# Patient Record
Sex: Male | Born: 1965 | Race: White | Hispanic: No | Marital: Married | Smoking: Never smoker
Health system: Southern US, Academic
[De-identification: ages and names within clinical notes are randomized; demographics above are authoritative.]

## PROBLEM LIST (undated history)

## (undated) DIAGNOSIS — J309 Allergic rhinitis, unspecified: Secondary | ICD-10-CM

## (undated) DIAGNOSIS — H9319 Tinnitus, unspecified ear: Secondary | ICD-10-CM

## (undated) DIAGNOSIS — M1990 Unspecified osteoarthritis, unspecified site: Secondary | ICD-10-CM

## (undated) DIAGNOSIS — I10 Essential (primary) hypertension: Secondary | ICD-10-CM

## (undated) DIAGNOSIS — K219 Gastro-esophageal reflux disease without esophagitis: Secondary | ICD-10-CM

## (undated) DIAGNOSIS — E7800 Pure hypercholesterolemia, unspecified: Secondary | ICD-10-CM

## (undated) HISTORY — PX: SKIN CANCER EXCISION: SHX779

## (undated) HISTORY — PX: HX ROTATOR CUFF REPAIR: SHX139

## (undated) HISTORY — PX: HX GALL BLADDER SURGERY/CHOLE: SHX55

## (undated) HISTORY — DX: Essential (primary) hypertension: I10

## (undated) HISTORY — DX: Unspecified osteoarthritis, unspecified site: M19.90

## (undated) HISTORY — PX: CARDIAC CATHETERIZATION: SHX172

## (undated) HISTORY — DX: Allergic rhinitis, unspecified: J30.9

## (undated) HISTORY — DX: Gastro-esophageal reflux disease without esophagitis: K21.9

## (undated) HISTORY — DX: Pure hypercholesterolemia, unspecified: E78.00

## (undated) HISTORY — PX: HX HIP REPLACEMENT: SHX124

---

## 1994-04-28 ENCOUNTER — Other Ambulatory Visit (HOSPITAL_COMMUNITY): Payer: Self-pay | Admitting: PHYSICIAN/UNDEFINED PHYSICIAN TYPE

## 2012-04-05 DIAGNOSIS — R1310 Dysphagia, unspecified: Secondary | ICD-10-CM | POA: Insufficient documentation

## 2012-04-05 DIAGNOSIS — R490 Dysphonia: Secondary | ICD-10-CM | POA: Insufficient documentation

## 2022-09-29 ENCOUNTER — Ambulatory Visit (RURAL_HEALTH_CENTER): Payer: 59 | Attending: INTERNAL MEDICINE | Admitting: INTERNAL MEDICINE

## 2022-09-29 ENCOUNTER — Encounter (RURAL_HEALTH_CENTER): Payer: Self-pay | Admitting: INTERNAL MEDICINE

## 2022-09-29 ENCOUNTER — Other Ambulatory Visit (RURAL_HEALTH_CENTER): Payer: Self-pay | Admitting: INTERNAL MEDICINE

## 2022-09-29 ENCOUNTER — Other Ambulatory Visit: Payer: Self-pay

## 2022-09-29 ENCOUNTER — Ambulatory Visit: Payer: 59 | Attending: INTERNAL MEDICINE | Admitting: INTERNAL MEDICINE

## 2022-09-29 VITALS — BP 131/84 | HR 76 | Resp 18 | Ht 71.0 in | Wt 208.0 lb

## 2022-09-29 DIAGNOSIS — E559 Vitamin D deficiency, unspecified: Secondary | ICD-10-CM | POA: Insufficient documentation

## 2022-09-29 DIAGNOSIS — Z23 Encounter for immunization: Secondary | ICD-10-CM | POA: Insufficient documentation

## 2022-09-29 DIAGNOSIS — Z125 Encounter for screening for malignant neoplasm of prostate: Secondary | ICD-10-CM

## 2022-09-29 DIAGNOSIS — M1990 Unspecified osteoarthritis, unspecified site: Secondary | ICD-10-CM | POA: Insufficient documentation

## 2022-09-29 DIAGNOSIS — M26609 Unspecified temporomandibular joint disorder, unspecified side: Secondary | ICD-10-CM | POA: Insufficient documentation

## 2022-09-29 DIAGNOSIS — K219 Gastro-esophageal reflux disease without esophagitis: Secondary | ICD-10-CM | POA: Insufficient documentation

## 2022-09-29 DIAGNOSIS — E782 Mixed hyperlipidemia: Secondary | ICD-10-CM | POA: Insufficient documentation

## 2022-09-29 DIAGNOSIS — J309 Allergic rhinitis, unspecified: Secondary | ICD-10-CM | POA: Insufficient documentation

## 2022-09-29 DIAGNOSIS — M159 Polyosteoarthritis, unspecified: Secondary | ICD-10-CM | POA: Insufficient documentation

## 2022-09-29 LAB — COMPREHENSIVE METABOLIC PNL, FASTING
ALBUMIN/GLOBULIN RATIO: 1.7 — ABNORMAL HIGH (ref 0.8–1.4)
ALBUMIN: 4.5 g/dL (ref 3.5–5.7)
ALKALINE PHOSPHATASE: 50 U/L (ref 34–104)
ALT (SGPT): 20 U/L (ref 7–52)
ANION GAP: 6 mmol/L (ref 4–13)
AST (SGOT): 16 U/L (ref 13–39)
BILIRUBIN TOTAL: 0.5 mg/dL (ref 0.3–1.2)
BUN/CREA RATIO: 18 (ref 6–22)
BUN: 18 mg/dL (ref 7–25)
CALCIUM, CORRECTED: 9.3 mg/dL (ref 8.9–10.8)
CALCIUM: 9.7 mg/dL (ref 8.6–10.3)
CHLORIDE: 105 mmol/L (ref 98–107)
CO2 TOTAL: 27 mmol/L (ref 21–31)
CREATININE: 0.98 mg/dL (ref 0.60–1.30)
ESTIMATED GFR: 91 mL/min/{1.73_m2} (ref >59)
GLOBULIN: 2.7 — ABNORMAL LOW (ref 2.9–5.4)
GLUCOSE: 99 mg/dL (ref 74–109)
OSMOLALITY, CALCULATED: 278 mOsm/kg (ref 270–290)
POTASSIUM: 4.6 mmol/L (ref 3.5–5.1)
PROTEIN TOTAL: 7.2 g/dL (ref 6.4–8.9)
SODIUM: 138 mmol/L (ref 136–145)

## 2022-09-29 LAB — CBC WITH DIFF
BASOPHIL #: 0 10*3/uL (ref 0.00–0.10)
BASOPHIL %: 1 % (ref 0–1)
EOSINOPHIL #: 0.2 10*3/uL (ref 0.00–0.50)
EOSINOPHIL %: 3 %
HCT: 45.9 % (ref 36.7–47.1)
HGB: 15.7 g/dL (ref 12.5–16.3)
LYMPHOCYTE #: 1.4 10*3/uL (ref 1.00–3.00)
LYMPHOCYTE %: 18 % (ref 16–44)
MCH: 30.9 pg (ref 23.8–33.4)
MCHC: 34.3 g/dL (ref 32.5–36.3)
MCV: 90.1 fL (ref 73.0–96.2)
MONOCYTE #: 0.5 10*3/uL (ref 0.30–1.00)
MONOCYTE %: 7 % (ref 5–13)
MPV: 8.1 fL (ref 7.4–11.4)
NEUTROPHIL #: 5.5 10*3/uL (ref 1.85–7.80)
NEUTROPHIL %: 72 % (ref 43–77)
PLATELETS: 196 10*3/uL (ref 140–440)
RBC: 5.1 10*6/uL (ref 4.06–5.63)
RDW: 12.9 % (ref 12.1–16.2)
WBC: 7.7 10*3/uL (ref 3.6–10.2)

## 2022-09-29 LAB — LIPID PANEL
CHOL/HDL RATIO: 6.3
CHOLESTEROL: 250 mg/dL — ABNORMAL HIGH (ref <200)
HDL CHOL: 40 mg/dL (ref 23–92)
LDL CALC: 173 mg/dL — ABNORMAL HIGH (ref 0–100)
TRIGLYCERIDES: 183 mg/dL — ABNORMAL HIGH (ref <=150)
VLDL CALC: 37 mg/dL (ref 0–50)

## 2022-09-29 LAB — VITAMIN D 25 TOTAL: VITAMIN D: 60 ng/mL (ref 30–100)

## 2022-09-29 LAB — PSA SCREENING: PSA: 1.53 ng/mL (ref <=4.00)

## 2022-09-29 MED ORDER — FLUTICASONE PROPIONATE 50 MCG/ACTUATION NASAL SPRAY,SUSPENSION
1.0000 | Freq: Two times a day (BID) | NASAL | 3 refills | Status: DC
Start: 2022-09-29 — End: 2023-01-11

## 2022-09-29 MED ORDER — OMEPRAZOLE 40 MG CAPSULE,DELAYED RELEASE
40.0000 mg | DELAYED_RELEASE_CAPSULE | Freq: Every day | ORAL | 3 refills | Status: DC
Start: 2022-09-29 — End: 2023-01-11

## 2022-09-29 MED ORDER — FAMOTIDINE 40 MG TABLET
40.0000 mg | ORAL_TABLET | Freq: Two times a day (BID) | ORAL | 3 refills | Status: DC
Start: 2022-09-29 — End: 2023-01-11

## 2022-09-29 MED ORDER — MONTELUKAST 10 MG TABLET
10.0000 mg | ORAL_TABLET | Freq: Every evening | ORAL | 3 refills | Status: DC
Start: 2022-09-29 — End: 2023-01-11

## 2022-09-29 NOTE — Nursing Note (Signed)
Patient is here for a follow up. No new issues.

## 2022-09-29 NOTE — Progress Notes (Signed)
[facility]  [address]  [place], [place]  [address]  Phone: [phone] Fax: [phone]    Name: Isaiah Long                       Date of Birth: Jan 27, 1966   MRN:  [id number]                         Date of visit: 09/29/2022     Chief Complaint: Follow Up (No new issues)    Current Outpatient Medications   Medication Sig    famotidine (PEPCID) 40 mg Oral Tablet Take 1 Tablet (40 mg total) by mouth Twice daily    fluticasone propionate (FLONASE) 50 mcg/actuation Nasal Spray, Suspension Administer 1 Spray into each nostril Twice daily    montelukast (SINGULAIR) 10 mg Oral Tablet Take 1 Tablet (10 mg total) by mouth Every evening    omeprazole (PRILOSEC) 40 mg Oral Capsule, Delayed Release(E.C.) Take 1 Capsule (40 mg total) by mouth Once a day       Patient Active Problem List    Diagnosis Date Noted    Mixed hyperlipidemia 09/29/2022    Gastroesophageal reflux disease without esophagitis 09/29/2022    Osteoarthritis 09/29/2022    Allergic rhinitis 09/29/2022    Vitamin D deficiency 09/29/2022    TMJ (temporomandibular joint disorder) 09/29/2022       Subjective:   Patient is here for CDM visit. Last visit was 08-05-21.    Colonoscopy scheduled next month.     Dyspnea  Ongoing since early 08-2020. Essentially since COVID diagnosis.  Following with Dr Viann Shove.  Per office note [phone] plan was holter, stress test, and echo.  10-03-20 CXR showed chronic pleural thickening.  [id number] Holter, Stress Test, and Echo were negative. Dr Viann Shove thinks its probably stress related.    GERD  On omeprazole and famotidine.  Following with Dr Keith Rake.    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  [place] in 2020 showed resolution    Hyperlipidemia  Has not generally tolerated statins 2/2 myalgia.  Was on Zetia. No meds in last few months.   Patient has history of muscle/tendon tear.  Does not qualify for injectables  Labs on 08-17-2019 showed total cholesterol 218, triglycerides  176, HDL 33, direct LDL 133. He was not on treatment.  Labs on 11-27-2019 showed total cholesterol 141, triglycerides 92, HDL 34, direct LDL 87  Labs on 02-27-2020 showed total cholesterol 165, triglycerides 99, HDL 34, direct LDL 112  Labs on 10-03-2020 showed total cholesterol 197, triglycerides 153, HDL 35, direct LDL 112. Dr Viann Shove stopped the lipitor and started zetia  Labs on 08-03-2021 showed total cholesterol 245, triglycerides 217, HDL 40, calculatedLDL 162.      Osteoarthritis  Primarily in left hip  Was on Ibuprofen  03-2021 left hip replacement with Dr Marcelino Scot    Allergic Rhinitis  On Flonase and Singulair.     Vitamin D Deficiency  Was on replacement  Labs on 08-17-2019 showed vitamin D 44.4  Labs on 11-27-2019 showed vitamin D 41.3  Labs on 02-11/2020 showed vitamin D 34.4    Itchy Ears/TMJ  Has been following with ENT.  Currently on drops (DermOtic oil)    ROS:  10 systems reviewed and were negative except as noted.   + joint pain    Objective :  BP 131/84 (Site: Left, Patient Position:  Sitting, Cuff Size: Adult)   Pulse 76   Resp 18   Ht 1.803 m (5\' 11" )   Wt 94.3 kg (208 lb)   SpO2 96%   BMI 29.01 kg/m       General:  appears in good health, comfortable  Lungs:  Clear to auscultation and percussion bilaterally.   Cardiovascular:  regular rate and rhythm, S1, S2 normal, no murmur, click, rub or gallop  Abdomen:  non-distended, Soft, non-tender  Neurologic:  Grossly normal. Alert and oriented x3  Psychiatric:  Normal    Data reviewed:      Assessment/Plan:  Assessment/Plan   1. Mixed hyperlipidemia    2. Gastroesophageal reflux disease without esophagitis    3. Primary osteoarthritis involving multiple joints    4. Allergic rhinitis    5. Vitamin D deficiency    6. TMJ (temporomandibular joint disorder)        Allergic Rhinitis  Continue Flonase and montelukast    GERD  Continue Omeprazole and Famotidine    Hyperlipidemia  Monitor    Vitamin D Deficiency  Monitor    TMJ  Follow with  ENT    Dyspnea  Follow with cardiology  Probably related to combination of stress and Long COVID Sx    OSA  Mild  Dr Linton Rump said not severe enough for CPAP yet    He will check on vaccines at his pharmacy.     Casimer Lanius, DO, [facility]  Internal Medicine

## 2022-09-30 ENCOUNTER — Other Ambulatory Visit (RURAL_HEALTH_CENTER): Payer: Self-pay | Admitting: INTERNAL MEDICINE

## 2022-09-30 MED ORDER — EZETIMIBE 10 MG TABLET
10.0000 mg | ORAL_TABLET | Freq: Every evening | ORAL | 4 refills | Status: DC
Start: 2022-09-30 — End: 2023-01-11

## 2022-10-01 LAB — LDL CHOLESTEROL, DIRECT: LDL DIRECT: 178 mg/dL — ABNORMAL HIGH (ref <100)

## 2022-10-13 ENCOUNTER — Other Ambulatory Visit (RURAL_HEALTH_CENTER): Payer: Self-pay | Admitting: INTERNAL MEDICINE

## 2022-10-13 MED ORDER — ATENOLOL 50 MG TABLET
50.0000 mg | ORAL_TABLET | Freq: Every day | ORAL | 4 refills | Status: AC
Start: 2022-10-13 — End: ?

## 2022-11-01 ENCOUNTER — Other Ambulatory Visit: Payer: Self-pay

## 2022-11-01 ENCOUNTER — Ambulatory Visit (INDEPENDENT_AMBULATORY_CARE_PROVIDER_SITE_OTHER): Payer: 59 | Admitting: NURSE PRACTITIONER

## 2022-11-01 ENCOUNTER — Encounter (INDEPENDENT_AMBULATORY_CARE_PROVIDER_SITE_OTHER): Payer: Self-pay | Admitting: NURSE PRACTITIONER

## 2022-11-01 VITALS — Ht 71.0 in | Wt 205.0 lb

## 2022-11-01 DIAGNOSIS — H9313 Tinnitus, bilateral: Secondary | ICD-10-CM

## 2022-11-01 DIAGNOSIS — R519 Headache, unspecified: Secondary | ICD-10-CM

## 2022-11-01 DIAGNOSIS — J343 Hypertrophy of nasal turbinates: Secondary | ICD-10-CM

## 2022-11-01 DIAGNOSIS — R0683 Snoring: Secondary | ICD-10-CM

## 2022-11-01 DIAGNOSIS — G4730 Sleep apnea, unspecified: Secondary | ICD-10-CM

## 2022-11-01 NOTE — Progress Notes (Signed)
ENT, [place]  [place] [address]  Phone: [phone]  Fax: [phone]      Encounter Date: 11/01/2022    Patient ID: Isaiah Long  MRN: [id number]    DOB: 01/07/1966  Age: 57 y.o. male     Progress Note       Referring Provider:  No ref. provider found    Reason for Visit:   Chief Complaint   Patient presents with    Sinus Problem     Patient complains of sinus headache/pain x 5-6 weeks. States just started new BP med d/t to ha and BP being elevated. Pt states he also snores        History of Present Illness:  Isaiah Long is a 57 y.o. male new complaint of headache and increased snoring.  Concern he could have sinusitis.  He also reports having an elevated BP for several weeks.  Has been started on BP medications and BP is better controlled.  Also having increased bilateral tinnitus      Patient History:  Patient Active Problem List   Diagnosis    Mixed hyperlipidemia    Gastroesophageal reflux disease without esophagitis    Osteoarthritis    Allergic rhinitis    Vitamin D deficiency    TMJ (temporomandibular joint disorder)    Dysphagia    Dysphonia     Current Outpatient Medications   Medication Sig    atenoloL (TENORMIN) 50 mg Oral Tablet Take 1 Tablet (50 mg total) by mouth Once a day    ezetimibe (ZETIA) 10 mg Oral Tablet Take 1 Tablet (10 mg total) by mouth Every evening    famotidine (PEPCID) 40 mg Oral Tablet Take 1 Tablet (40 mg total) by mouth Twice daily    fluticasone propionate (FLONASE) 50 mcg/actuation Nasal Spray, Suspension Administer 1 Spray into each nostril Twice daily    montelukast (SINGULAIR) 10 mg Oral Tablet Take 1 Tablet (10 mg total) by mouth Every evening    omeprazole (PRILOSEC) 40 mg Oral Capsule, Delayed Release(E.C.) Take 1 Capsule (40 mg total) by mouth Once a day      Allergies   Allergen Reactions    Doxycycline Rash    Dexamethasone (Pf)  Other Adverse Reaction (Add comment)     hiccups     Past Medical History:   Diagnosis Date    Allergic  rhinitis     Esophageal reflux     Essential hypertension     Hypercholesterolemia     Osteoarthritis      Past Surgical History:   Procedure Laterality Date    HX CHOLECYSTECTOMY      HX HIP REPLACEMENT      HX ROTATOR CUFF REPAIR       Family Medical History:       Problem Relation (Age of Onset)    Asthma Mother    Atrial fibrillation Mother    Prostate Cancer Father            Social History     Tobacco Use    Smoking status: Never    Smokeless tobacco: Never   Vaping Use    Vaping status: Never Used   Substance Use Topics    Alcohol use: Never    Drug use: Never       Review of Systems     Vitals:    11/01/22 0942   Weight: 93 kg (205 lb)   Height: 1.803 m (5\' 11" )  BMI: 28.65      ENT Physical Exam  Constitutional  Appearance: patient appears well-developed, well-nourished and well-groomed,  Communication/Voice: communication appropriate for developmental age; vocal quality normal;  Head and Face  Appearance: head appears normal, face appears normal and face appears atraumatic;  Palpation: facial palpation normal;  Salivary: glands normal;  Ear  Hearing: intact;  Auricles: right auricle normal; left auricle normal;  External Mastoids: right external mastoid normal; left external mastoid normal;  Ear Canals: right ear canal normal; left ear canal normal;  Tympanic Membranes: right tympanic membrane normal; left tympanic membrane normal;  Nose  External Nose: nares patent bilaterally; external nose normal;  Internal Nose: nasal mucosa normal; septum normal; bilateral inferior turbinates normal;  Oral Cavity/Oropharynx  Lips: normal;  Teeth: normal;  Gums: gingiva normal;  Tongue: normal;  Oral mucosa: normal;  Hard palate: normal;  Soft palate: normal;  Tonsils: normal;  Base of Tongue: normal;  Posterior pharyngeal wall: normal;  Neck  Neck: neck normal; neck palpation normal;  Thyroid: thyroid normal;  Respiratory  Inspection: breathing unlabored; normal breathing rate;  Lymphatic  Palpation: lymph nodes  normal;  Neurovestibular  Mental Status: alert and oriented;  Psychiatric: mood normal; affect is appropriate;  Cranial Nerves: cranial nerves intact;       Assessment:  ENCOUNTER DIAGNOSES     ICD-10-CM   1. Sleep disorder breathing  G47.30   2. Headache  R51.9   3. 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  No sinusitis noted on nasal endoscopy  Headache and tinnitus could be due to elevated BP.  Advised to continue to follow up with PCP for management of BP.  PSG scheduled to further evaluate snoring and headache  Audiogram scheduled.    Orders Placed This Encounter    [phone] - NASAL ENDOSCOPY DIAGNOSTIC UNILATERAL OR BILATERAL (AMB ONLY)    AMB PRN REFERRAL EXTERNAL AUDIOLOGIST    POLYSOMNOGRAPHY-ANP OVERNIGHT-1ST NIGHT OF STUDY (F/U WITH CPAP IF ANP MEETS CLINICAL CRITERIA)     No follow-ups on file.    Emiliano Dyer, FNP-BC  11/01/2022, 10:03

## 2022-11-08 ENCOUNTER — Encounter (INDEPENDENT_AMBULATORY_CARE_PROVIDER_SITE_OTHER): Payer: Self-pay | Admitting: NURSE PRACTITIONER

## 2022-11-08 NOTE — Procedures (Signed)
ENT, [place]  [place] [address]    Procedure Note    Name: GERALD HARDAWAY MRN:  [id number]   Date: 11/01/2022 DOB:  March 27, 1966 (57 y.o.)         [address]31 - NASAL ENDOSCOPY DIAGNOSTIC UNILATERAL OR BILATERAL (AMB ONLY)    Performed by: Emiliano Dyer, FNP-BC  Authorized by: Emiliano Dyer, FNP-BC    Time Out:     Immediately before the procedure, a time out was called:  Yes    Patient verified:  Yes    Procedure Verified:  Yes    Site Verified:  Yes  Documentation:      Indications for procedure: Snoring / Sleep apnea    Anesthesia: Oxymetazoline nasal spray    Description: Nasal endoscopy with rigid scope was performed with examination of the  septum, inferior, middle, and superior meatus, turbinates, sphenoethmoidal recess, and nasopharynx.     There were no polyps, pus, or granulation tissue noted.  ET orifices and nasopharynx were normal.     Findings: Inferior turbinate hypertrophy    The patient tolerated the procedure well.               Emiliano Dyer, FNP-BC

## 2022-11-18 ENCOUNTER — Encounter (INDEPENDENT_AMBULATORY_CARE_PROVIDER_SITE_OTHER): Payer: Self-pay | Admitting: NURSE PRACTITIONER

## 2022-12-01 ENCOUNTER — Other Ambulatory Visit (INDEPENDENT_AMBULATORY_CARE_PROVIDER_SITE_OTHER): Payer: Self-pay | Admitting: NURSE PRACTITIONER

## 2022-12-01 DIAGNOSIS — G4730 Sleep apnea, unspecified: Secondary | ICD-10-CM

## 2022-12-06 ENCOUNTER — Other Ambulatory Visit (RURAL_HEALTH_CENTER): Payer: Self-pay

## 2022-12-07 ENCOUNTER — Encounter (INDEPENDENT_AMBULATORY_CARE_PROVIDER_SITE_OTHER): Payer: 59 | Admitting: NURSE PRACTITIONER

## 2022-12-24 ENCOUNTER — Ambulatory Visit (HOSPITAL_COMMUNITY): Payer: 59

## 2022-12-24 ENCOUNTER — Ambulatory Visit (HOSPITAL_COMMUNITY): Payer: Self-pay

## 2022-12-27 ENCOUNTER — Other Ambulatory Visit: Payer: Self-pay

## 2022-12-27 ENCOUNTER — Inpatient Hospital Stay
Admission: RE | Admit: 2022-12-27 | Discharge: 2022-12-27 | Disposition: A | Discharge status: Home / Self Care | Payer: 59 | Source: Ambulatory Visit | Attending: NURSE PRACTITIONER | Admitting: NURSE PRACTITIONER

## 2022-12-27 DIAGNOSIS — G4730 Sleep apnea, unspecified: Secondary | ICD-10-CM | POA: Insufficient documentation

## 2022-12-28 ENCOUNTER — Encounter (INDEPENDENT_AMBULATORY_CARE_PROVIDER_SITE_OTHER): Payer: 59 | Admitting: NURSE PRACTITIONER

## 2022-12-28 NOTE — Procedures (Signed)
[facility]    Home Sleep Study    Patient Name: Isaiah Long  Date of Birth: 1965-09-14  Gender: male  Provider: Roxana Hires  Location: [facility]  Location Address: [address] [place], [address]  Location Phone: [phone]      Primary Care Provider: Terald Sleeper, DO    Procedure Home Sleep Test     Past Medical History  Past Medical History:   Diagnosis Date    Allergic rhinitis     Esophageal reflux     Essential hypertension     Hypercholesterolemia     Osteoarthritis            Past Surgical History  Past Surgical History:   Procedure Laterality Date    HX CHOLECYSTECTOMY      HX HIP REPLACEMENT      HX ROTATOR CUFF REPAIR             Medication LIst  No outpatient medications have been marked as taking for the 12/27/22 encounter [facility] Encounter) with SLEEP LAB RM 1 PRN.       Allergy List  Allergies   Allergen Reactions    Doxycycline Rash    Dexamethasone (Pf)  Other Adverse Reaction (Add comment)     hiccups       Family Medical History  Family Medical History:       Problem Relation (Age of Onset)    Asthma Mother    Atrial fibrillation Mother    Prostate Cancer Father              Social History  Social History     Socioeconomic History    Marital status: Married     Spouse name: Randa    Number of children: 8   Tobacco Use    Smoking status: Never    Smokeless tobacco: Never   Vaping Use    Vaping status: Never Used   Substance and Sexual Activity    Alcohol use: Never    Drug use: Never         Patient Information  Sleep apnea symptoms: Snoring and Witnessed Apneas  Patient's Height: 71 inches  Patient's Weight: 205  Patient's BMI: 32  Neck Circumference: 15  Epworth Scale Score: 7      Date of Study: 1966-01-28  Study Performed By: Ula Lingo, RRT, RPSGT, [facility]  Study Reviewed By: Ula Lingo, RRT, RPSGT, [facility]    Procedure: Home Sleep study was Performed using: Alice night one. Channels recorded: Airflow acquired by using a nasal  pressure cannula, oxygen saturation (SpO2) was monitored using a pulse oximeter, respiratory effort, snoring sound , and heart rate    Sleep Architecture: Total Recording Time: 482.5 min. Total Sleep Time: 482.5 min.     Respiratory Data: Number of Obstructive Apneas Observed: 17. Number of Central Apneas Observed: 0. Number of Mixed Apneas Observed: 0. Total Number of Apneas: 17. Total number of Hypopneas: 26. Apnea/Hypoapnea Index (AHI) per hour: 5.3. Percent of Time Patient Spent in Supine Position: 173.3. AHI in Supine Position : 6.2. Non-supine AHI #: 3.4. Lowest Desaturation Percentage: 83%. Total Amount of Desaturated Time Below or Equal  to 7.3 minutes. Snoring During the Study: Status: Yes.     The study was scored by: 30 second EPOCH, 4 % desaturation and  CMS guidelines.AASM Accredited     Cardiac Data:  The Mean Heart Rate During the Study (Beats/Min): 56.2.   Assessment:  Diagnosis:   (R06.83) Snoring (SCT) and (G47.33) OSA - Obstructive sleep apnea (SCT)    PLAN  Procedure Orders    In lab Sleep Study    Instructions:   Patient advised to avoid sedatives, hypnotics, sedating antihistamines and alcohol until CPAP titration is completed and therapy initiated., Reviewed raw sleep data and agree with therapeutic directions outlined in report., The natural history of obstructive sleep apnea and its tendency to be associated with cardiac, pulmonary, neuropsychiatric discussed in detail with patient., The etiology of OSA was discussed in detail with patient, All benefits of CPAP/BiPAP discussed in detail, Advised to call if any difficulties or questions regarding CPAP/BiPAP device, and Positional Therapy may be helpful in snoring    Impression:  Ahi 5.3 per hour and Mild OSA     Claudell Kyle, RRT    Sleep attending  I reviewed the raw data from the home sleep study in detail.    Patient had good sleep efficiency.    Patient had mild OSA AHI of 5.3  We will need in-lab titration study.    Charlaine Dalton,  MD  01/03/2023 08:11

## 2023-01-10 ENCOUNTER — Ambulatory Visit (INDEPENDENT_AMBULATORY_CARE_PROVIDER_SITE_OTHER): Payer: 59 | Admitting: NURSE PRACTITIONER

## 2023-01-10 ENCOUNTER — Other Ambulatory Visit: Payer: Self-pay

## 2023-01-10 ENCOUNTER — Encounter (INDEPENDENT_AMBULATORY_CARE_PROVIDER_SITE_OTHER): Payer: Self-pay | Admitting: NURSE PRACTITIONER

## 2023-01-10 VITALS — BP 134/88 | Ht 71.0 in | Wt 207.0 lb

## 2023-01-10 DIAGNOSIS — G4733 Obstructive sleep apnea (adult) (pediatric): Secondary | ICD-10-CM

## 2023-01-10 DIAGNOSIS — H9313 Tinnitus, bilateral: Secondary | ICD-10-CM

## 2023-01-10 DIAGNOSIS — H903 Sensorineural hearing loss, bilateral: Secondary | ICD-10-CM

## 2023-01-10 NOTE — Progress Notes (Signed)
ENT, [facility]  [address]  [place] [address]  Phone: [phone]  Fax: [phone]      Encounter Date: 01/10/2023    Patient ID: Isaiah Long  MRN: [id number]    DOB: 03-Apr-1966  Age: 57 y.o. male     Progress Note       Referring Provider:  No ref. provider found    Reason for Visit:   Chief Complaint   Patient presents with    Follow-up After Testing     Rc after psg and audio, patient states ha is still present        History of Present Illness:  Isaiah Long is a 57 y.o. male follow up after PSG for chronic headache and snoring.  Audiogram completed due to tinnitus.      Patient History:  Patient Active Problem List   Diagnosis    Mixed hyperlipidemia    Gastroesophageal reflux disease without esophagitis    Osteoarthritis    Allergic rhinitis    Vitamin D deficiency    TMJ (temporomandibular joint disorder)    Dysphagia    Dysphonia    Statin myopathy     Current Outpatient Medications   Medication Sig    amoxicillin-pot clavulanate (AUGMENTIN) 875-125 mg Oral Tablet Take 1 Tablet by mouth Twice daily    atenoloL (TENORMIN) 50 mg Oral Tablet Take 1 Tablet (50 mg total) by mouth Once a day    famotidine (PEPCID) 40 mg Oral Tablet Take 1 Tablet (40 mg total) by mouth Twice daily    fluticasone propionate (FLONASE) 50 mcg/actuation Nasal Spray, Suspension Administer 1 Spray into each nostril Twice daily    montelukast (SINGULAIR) 10 mg Oral Tablet Take 1 Tablet (10 mg total) by mouth Every evening    omeprazole (PRILOSEC) 40 mg Oral Capsule, Delayed Release(E.C.) Take 1 Capsule (40 mg total) by mouth Once a day      Allergies   Allergen Reactions    Doxycycline Rash    Dexamethasone (Pf)  Other Adverse Reaction (Add comment)     hiccups     Past Medical History:   Diagnosis Date    Allergic rhinitis     Esophageal reflux     Essential hypertension     Hypercholesterolemia     Osteoarthritis      Past Surgical History:   Procedure Laterality Date    HX CHOLECYSTECTOMY      HX HIP  REPLACEMENT      HX ROTATOR CUFF REPAIR      SKIN CANCER EXCISION       Family Medical History:       Problem Relation (Age of Onset)    Asthma Mother    Atrial fibrillation Mother    Prostate Cancer Father            Social History     Tobacco Use    Smoking status: Never    Smokeless tobacco: Never   Vaping Use    Vaping status: Never Used   Substance Use Topics    Alcohol use: Never    Drug use: Never       Review of Systems     Vitals:    01/10/23 0826   BP: 134/88   Weight: 93.9 kg (207 lb)   Height: 1.803 m (5\' 11" )   BMI: 28.93      ENT Physical Exam  Constitutional  Appearance: patient appears well-developed, well-nourished and well-groomed,  Communication/Voice: communication appropriate for developmental  age; vocal quality normal;  Head and Face  Appearance: head appears normal, face appears normal and face appears atraumatic;  Palpation: facial palpation normal;  Salivary: glands normal;  Ear  Hearing: intact;  Auricles: right auricle normal; left auricle normal;  External Mastoids: right external mastoid normal; left external mastoid normal;  Ear Canals: right ear canal normal; left ear canal normal;  Tympanic Membranes: right tympanic membrane normal; left tympanic membrane normal;  Nose  External Nose: nares patent bilaterally; external nose normal;  Internal Nose: nasal mucosa normal; septum normal; bilateral inferior turbinates normal;  Oral Cavity/Oropharynx  Lips: normal;  Teeth: normal;  Gums: gingiva normal;  Tongue: normal;  Oral mucosa: normal;  Hard palate: normal;  Soft palate: normal;  Tonsils: normal;  Base of Tongue: normal;  Posterior pharyngeal wall: normal;  Neck  Neck: neck normal; neck palpation normal;  Thyroid: thyroid normal;  Respiratory  Inspection: breathing unlabored; normal breathing rate;  Lymphatic  Palpation: lymph nodes normal;  Neurovestibular  Mental Status: alert and oriented;  Psychiatric: mood normal; affect is appropriate;  Cranial Nerves: cranial nerves intact;        Assessment:  ENCOUNTER DIAGNOSES     ICD-10-CM   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 OSA (obstructive sleep apnea)  G47.33       Plan:  Medical records reviewed on 01/10/2023.  Audiogram from [facility] 12/08/22 normal to mild SNHL left and normal to moderately severe SNHL right ear. Repeat audiogram in 6 months    PSG home study mld OSA with an AHI 5.3.  Discussed CPAP titration.  He does not want this done.     Orders Placed This Encounter    AMB PRN REFERRAL EXTERNAL AUDIOLOGIST     No follow-ups on file.    Elnora Morrison, FNP-BC  01/10/2023, 08:32

## 2023-01-11 ENCOUNTER — Ambulatory Visit (RURAL_HEALTH_CENTER): Payer: 59 | Attending: INTERNAL MEDICINE | Admitting: INTERNAL MEDICINE

## 2023-01-11 ENCOUNTER — Encounter (RURAL_HEALTH_CENTER): Payer: Self-pay | Admitting: INTERNAL MEDICINE

## 2023-01-11 VITALS — BP 127/85 | HR 69 | Resp 18 | Ht 71.0 in | Wt 210.0 lb

## 2023-01-11 DIAGNOSIS — M159 Polyosteoarthritis, unspecified: Secondary | ICD-10-CM

## 2023-01-11 DIAGNOSIS — H9319 Tinnitus, unspecified ear: Secondary | ICD-10-CM

## 2023-01-11 DIAGNOSIS — K219 Gastro-esophageal reflux disease without esophagitis: Secondary | ICD-10-CM | POA: Insufficient documentation

## 2023-01-11 DIAGNOSIS — H539 Unspecified visual disturbance: Secondary | ICD-10-CM | POA: Insufficient documentation

## 2023-01-11 DIAGNOSIS — R490 Dysphonia: Secondary | ICD-10-CM | POA: Insufficient documentation

## 2023-01-11 DIAGNOSIS — C4442 Squamous cell carcinoma of skin of scalp and neck: Secondary | ICD-10-CM

## 2023-01-11 DIAGNOSIS — E782 Mixed hyperlipidemia: Secondary | ICD-10-CM | POA: Insufficient documentation

## 2023-01-11 DIAGNOSIS — E559 Vitamin D deficiency, unspecified: Secondary | ICD-10-CM

## 2023-01-11 DIAGNOSIS — R519 Headache, unspecified: Secondary | ICD-10-CM

## 2023-01-11 DIAGNOSIS — M26609 Unspecified temporomandibular joint disorder, unspecified side: Secondary | ICD-10-CM | POA: Insufficient documentation

## 2023-01-11 DIAGNOSIS — T466X5A Adverse effect of antihyperlipidemic and antiarteriosclerotic drugs, initial encounter: Secondary | ICD-10-CM | POA: Insufficient documentation

## 2023-01-11 MED ORDER — MONTELUKAST 10 MG TABLET
10.0000 mg | ORAL_TABLET | Freq: Every evening | ORAL | 3 refills | Status: AC
Start: 2023-01-11 — End: ?

## 2023-01-11 MED ORDER — FLUTICASONE PROPIONATE 50 MCG/ACTUATION NASAL SPRAY,SUSPENSION
1.0000 | Freq: Two times a day (BID) | NASAL | 3 refills | Status: AC
Start: 2023-01-11 — End: ?

## 2023-01-11 MED ORDER — HYDROCORTISONE ACETATE 25 MG RECTAL SUPPOSITORY
25.0000 mg | Freq: Two times a day (BID) | RECTAL | 1 refills | Status: AC | PRN
Start: 2023-01-11 — End: 2023-01-21

## 2023-01-11 MED ORDER — FAMOTIDINE 40 MG TABLET
40.0000 mg | ORAL_TABLET | Freq: Two times a day (BID) | ORAL | 3 refills | Status: AC
Start: 2023-01-11 — End: 2024-01-11

## 2023-01-11 MED ORDER — OMEPRAZOLE 40 MG CAPSULE,DELAYED RELEASE
40.0000 mg | DELAYED_RELEASE_CAPSULE | Freq: Every day | ORAL | 3 refills | Status: AC
Start: 2023-01-11 — End: ?

## 2023-01-11 NOTE — Progress Notes (Signed)
[facility]  [address]  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 )    Current Outpatient Medications   Medication Sig    atenoloL (TENORMIN) 50 mg Oral Tablet Take 1 Tablet (50 mg total) by mouth Once a day    ezetimibe (ZETIA) 10 mg Oral Tablet Take 1 Tablet (10 mg total) by mouth Every evening    famotidine (PEPCID) 40 mg Oral Tablet Take 1 Tablet (40 mg total) by mouth Twice daily    fluticasone propionate (FLONASE) 50 mcg/actuation Nasal Spray, Suspension Administer 1 Spray into each nostril Twice daily    montelukast (SINGULAIR) 10 mg Oral Tablet Take 1 Tablet (10 mg total) by mouth Every evening    omeprazole (PRILOSEC) 40 mg Oral Capsule, Delayed Release(E.C.) Take 1 Capsule (40 mg total) by mouth Once a day       Patient Active Problem List    Diagnosis Date Noted    Statin myopathy 01/11/2023    Mixed hyperlipidemia 09/29/2022    Gastroesophageal reflux disease without esophagitis 09/29/2022    Osteoarthritis 09/29/2022    Allergic rhinitis 09/29/2022    Vitamin D deficiency 09/29/2022    TMJ (temporomandibular joint disorder) 09/29/2022    Dysphagia 04/05/2012    Dysphonia 04/05/2012       Subjective:   Patient is here for CDM visit.     Complaining of elevated blood pressure. Is taking atenolol. States he is only taking half o what was prescribed due to bradycardia.     Had squamous cell removed from head in April. Follows on 5-23. He had to put in a granulation patch and they will be putting in another.     Following with ENT for tinnitus. They are recommending imaging.   He is having headaches as well as visual changes.    Having issue with hemorrhoids. Asking about surgical options.     Had colonoscopy  with K Patel 2 months ago.     Dyspnea  Ongoing since early 08-2020. Essentially since COVID diagnosis.  Following with Dr Renda Rolls.  Per office note [phone] plan was holter, stress test, and echo.  10-03-20 CXR showed chronic pleural thickening.  [id number] Holter, Stress Test, and Echo were negative. Dr Renda Rolls thinks its probably stress related.    GERD  On omeprazole and famotidine.  Following with Dr Concha Se.    NAFLD  Following with Dr Concha Se  Patient has lost >20 pounds since initial diagnosis.  [place] in 2020 showed resolution    Hyperlipidemia  Has not generally tolerated statins 2/2 myalgia.  Was on Zetia. No meds in last few months.   Patient has history of muscle/tendon tear.  Does not qualify for injectables  Labs on 08-17-2019 showed total cholesterol 218, triglycerides 176, HDL 33, direct LDL 133. He was not on treatment.  Labs on 11-27-2019 showed total cholesterol 141, triglycerides 92, HDL 34, direct LDL 87  Labs on 02-27-2020 showed total  cholesterol 165, triglycerides 99, HDL 34, direct LDL 112  Labs on 10-03-2020 showed total cholesterol 197, triglycerides 153, HDL 35, direct LDL 112. Dr Renda Rolls stopped the lipitor and started zetia  Labs on 08-03-2021 showed total cholesterol 245, triglycerides 217, HDL 40, calculatedLDL 162.    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     Vitamin D Deficiency  Was on replacement  Labs on 08-17-2019 showed vitamin D 44.4  Labs on 11-27-2019 showed vitamin D 41.3  Labs on 02-11/2020 showed vitamin D 34.4  Labs on 09-29-2022 showed vitamin D 60    Itchy Ears/TMJ  Has been following with ENT.  Currently on drops (DermOtic oil)    ROS:  10 systems reviewed and were negative except as noted.   + joint pain    Objective :  BP 127/85 (Site: Right, Patient Position: Sitting, Cuff  Size: Adult)   Pulse 69   Resp 18   Ht 1.803 m (5\' 11" )   Wt 95.3 kg (210 lb)   SpO2 94%   BMI 29.29 kg/m       General:  appears in good health, comfortable  Lungs:  Clear to auscultation and percussion bilaterally.   Cardiovascular:  regular rate and rhythm, S1, S2 normal, no murmur, click, rub or gallop  Abdomen:  non-distended, Soft, non-tender  Neurologic:  Grossly normal. Alert and oriented x3  Psychiatric:  Normal    Data reviewed:      Assessment/Plan:  Assessment/Plan   1. Mixed hyperlipidemia    2. Dysphonia    3. Gastroesophageal reflux disease without esophagitis    4. Vitamin D deficiency    5. Primary osteoarthritis involving multiple joints    6. TMJ (temporomandibular joint disorder)      Allergic Rhinitis  Continue Flonase and montelukast    GERD  Continue Omeprazole and Famotidine    Hyperlipidemia  Monitor    Vitamin D Deficiency  Monitor    TMJ  Follow with ENT    Dyspnea  Follow with cardiology  Probably related to combination of stress and Long COVID Sx    OSA  Mild  Dr Felton Clinton said not severe enough for CPAP yet    Hemorrhoid  Anusol  Patient will try to contact Dr Jeanella Anton    Headache/Visual Change/Tinnitus  Will try for MRI    Hypertension  Follow with Dr Renda Rolls      Terald Sleeper, DO, [facility]  Internal Medicine

## 2023-01-11 NOTE — Nursing Note (Signed)
Patient is here for his follow up . Patient has several issues today.

## 2023-01-12 ENCOUNTER — Other Ambulatory Visit (INDEPENDENT_AMBULATORY_CARE_PROVIDER_SITE_OTHER): Payer: Self-pay | Admitting: PULMONARY DISEASE

## 2023-01-12 DIAGNOSIS — G4733 Obstructive sleep apnea (adult) (pediatric): Secondary | ICD-10-CM

## 2023-01-25 IMAGING — MR MRI BRAIN WITHOUT AND WITH CONTRAST
10 of 13 series · 35 of 48 positions shown · IV contrast (gadavist)
Comparison: None previous available.

﻿EXAM:  [id number]   MRI BRAIN WITHOUT AND WITH CONTRAST INCLUDING INTERNAL AUDITORY CANALS:
INDICATION: 57-year-old with history of tinnitus, visual disturbance, headache.  History of squamous cell carcinoma of the forehead.
TECHNIQUE: Axial, coronal and sagittal images were obtained including postcontrast series after administration of 10 mL Gadavist IV. Thin section high-resolution images of cerebellopontine angles and posterior fossa were obtained.

[Series 6: DWI · axial · 5.0mm · 1.35mm/px · z∈[-72,+66]mm · 11 of 96 slices shown (1 of 3)]
[im 1/96]
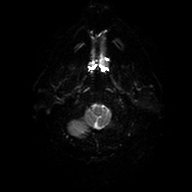
[im 10/96]
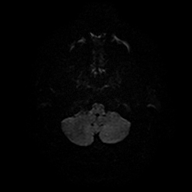
[im 20/96]
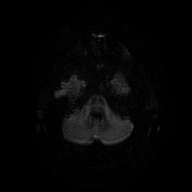
[im 29/96]
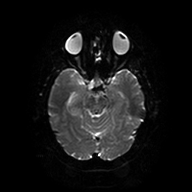
[im 39/96]
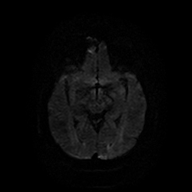
[im 48/96]
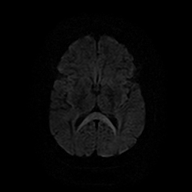
[im 58/96]
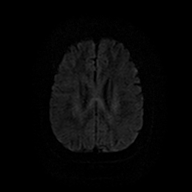
[im 67/96]
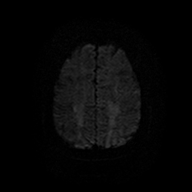
[im 77/96]
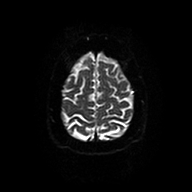
[im 86/96]
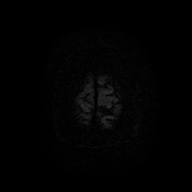
[im 96/96]
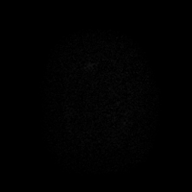

[Series 7: DWI · axial · 5.0mm · 1.35mm/px · z∈[-72,+66]mm · 3 of 24 slices shown (2 of 3)]
[im 1/24]
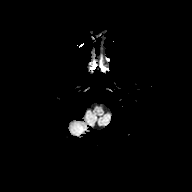
[im 12/24]
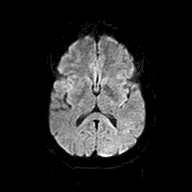
[im 24/24]
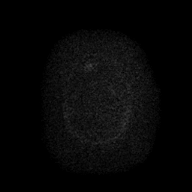

[Series 8: DWI · axial · 5.0mm · 1.35mm/px · z∈[-72,+66]mm · 3 of 24 slices shown (3 of 3)]
[im 1/24]
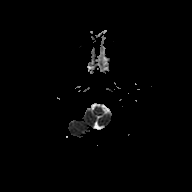
[im 12/24]
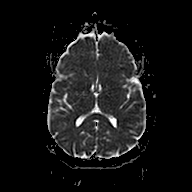
[im 24/24]
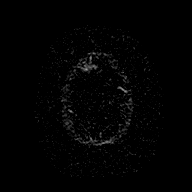

[Series 9: FLAIR · sagittal · 4.2mm · 0.75mm/px · 3 of 28 slices shown (1 of 2)]
[im 1/28]
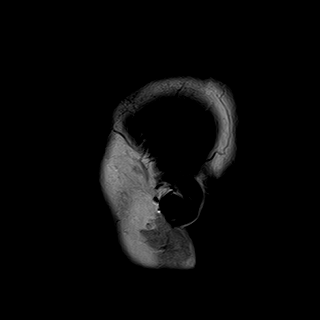
[im 14/28]
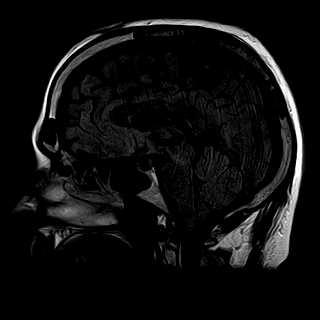
[im 28/28]
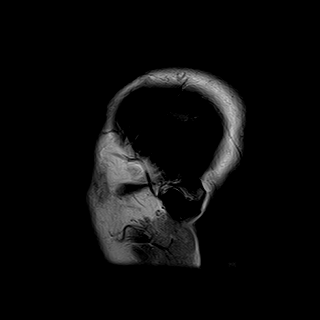

[Series 10: T2 · axial · 4.0mm · 0.43mm/px · z∈[-65,+74]mm · 4 of 36 slices shown]
[im 1/36]
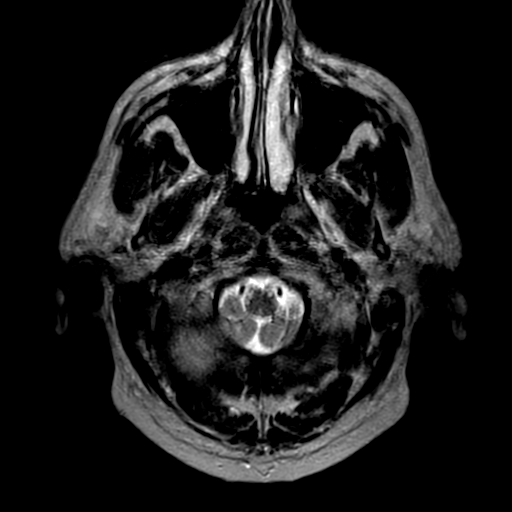
[im 12/36]
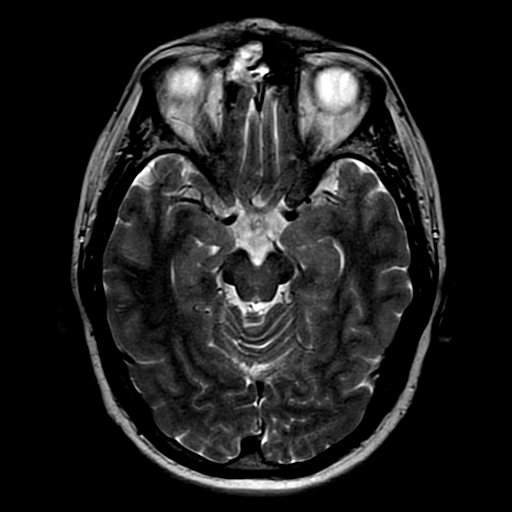
[im 24/36]
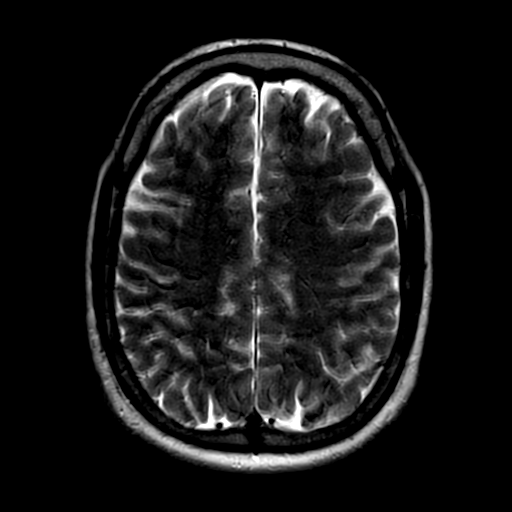
[im 36/36]
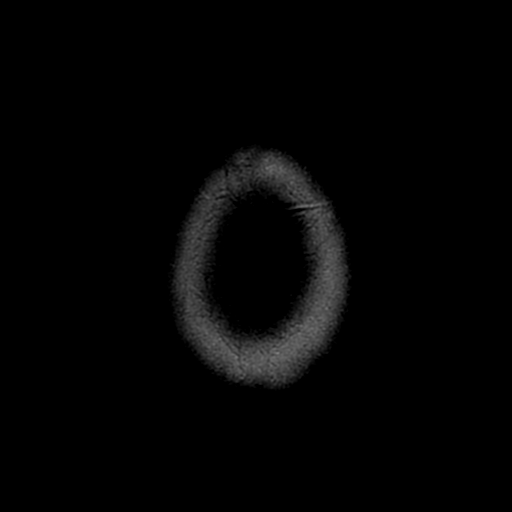

[Series 11: FLAIR · axial · 4.0mm · 0.43mm/px · z∈[-65,+74]mm · 4 of 36 slices shown (2 of 2)]
[im 1/36]
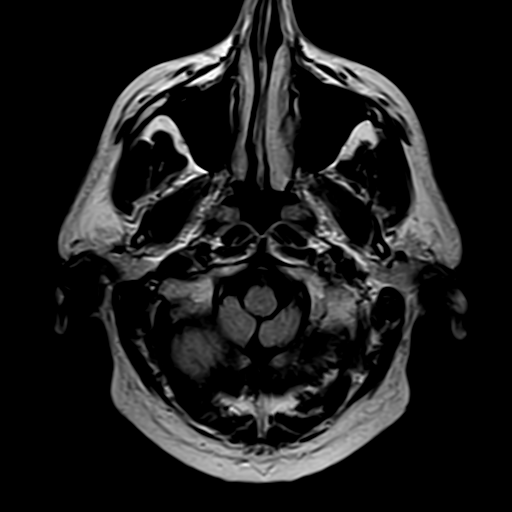
[im 12/36]
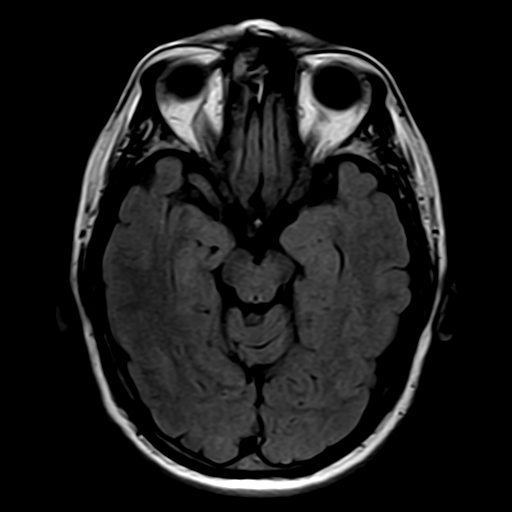
[im 24/36]
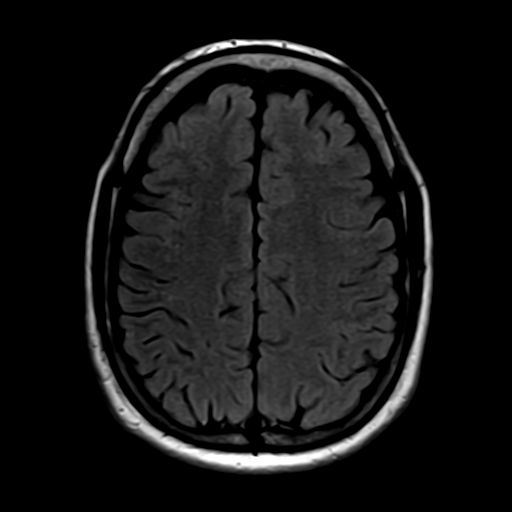
[im 36/36]
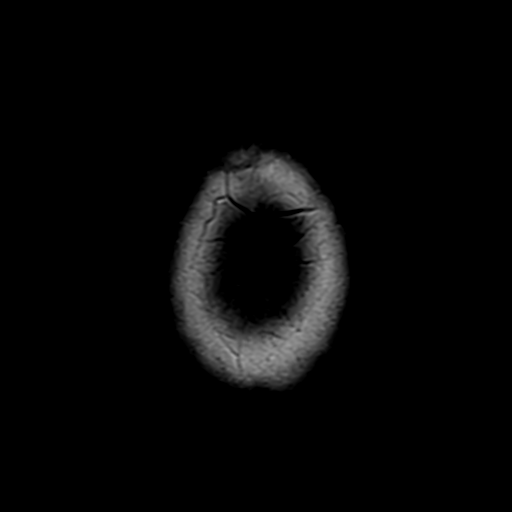

[Series 12: T1 · axial · 4.0mm · 0.43mm/px · 1 of 36 slices shown]
[im 1/36]
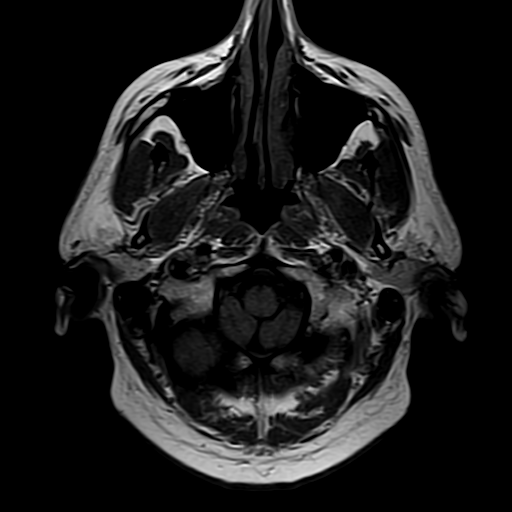

[Series 16: T1 post-contrast · axial · 3.0mm · 0.47mm/px · 1 of 11 slices shown (1 of 3)]
[im 1/11]
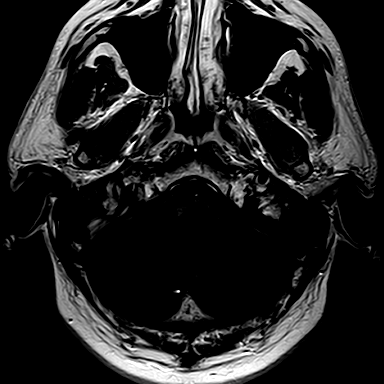

[Series 17: T1 post-contrast · coronal · 3.0mm · 0.47mm/px · 1 of 11 slices shown (2 of 3)]
[im 1/11]
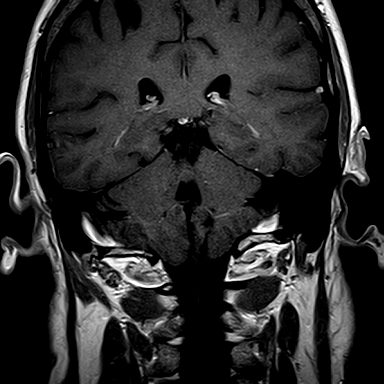

[Series 18: T1 post-contrast · axial · 4.0mm · 0.43mm/px · z∈[-65,+74]mm · 4 of 36 slices shown (3 of 3)]
[im 1/36]
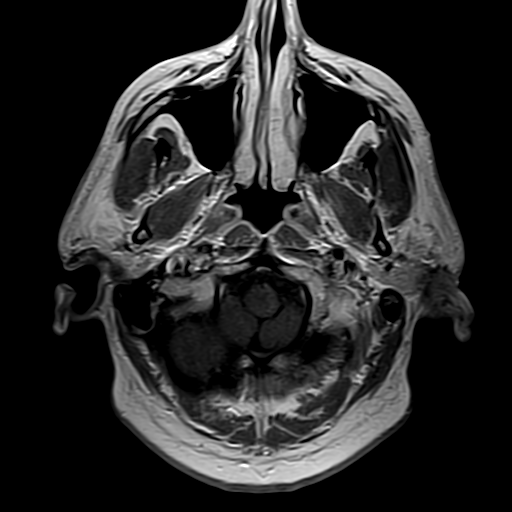
[im 12/36]
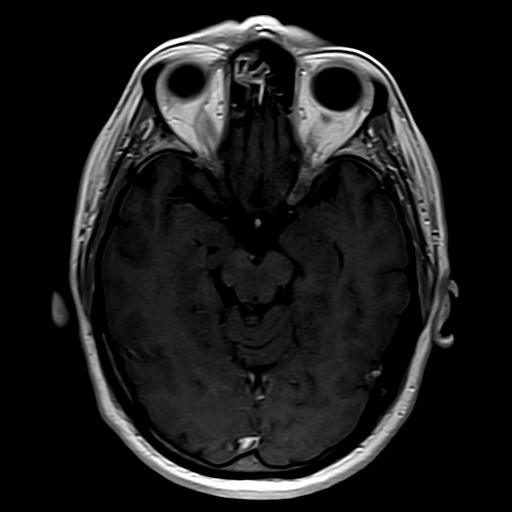
[im 24/36]
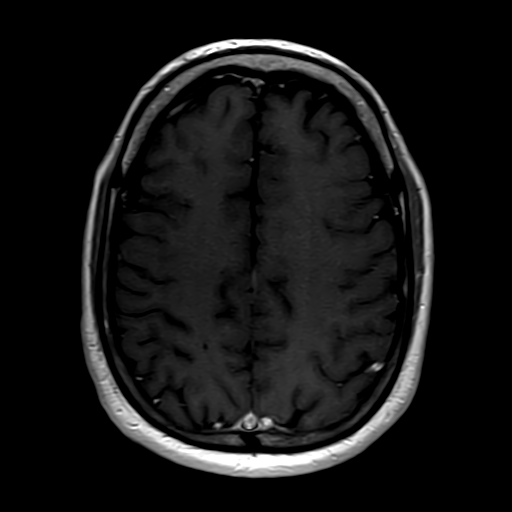
[im 36/36]
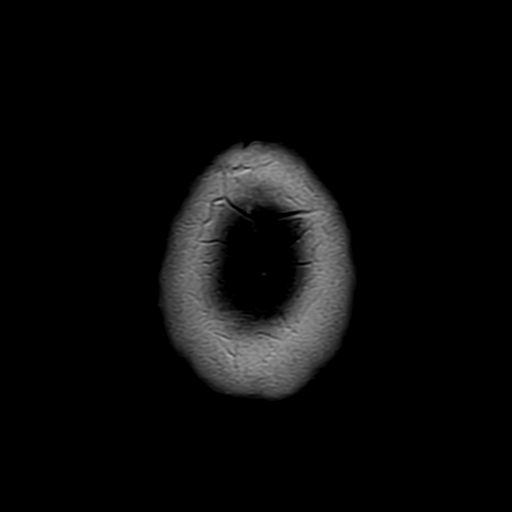

[35 of 48 positions shown; findings below may reference images not displayed]

FINDINGS: No focal areas of restricted diffusion.  No evidence of intracranial bleed or extra-axial collections are seen. No evidence of ventriculomegaly or midline shift.

Normal pituitary gland.  No abnormal enhancement on the postcontrast study.  No space-occupying lesions of the cerebellopontine angles are seen. 

Major arteries of circle of Willis and dural venous sinuses are patent. 

Deformity of the medial wall of the orbit is noted, protruding into the ethmoid cells on the left side. This is most likely due to old fracture of the medial wall of the left orbit.  No enhancing mass is noted in this region.

Sinusitis involving the right frontal sinus, ethmoid sinuses is noted. Mastoids are clear.
IMPRESSION: 1. No intracranial space-occupying lesions or abnormal enhancement on the postcontrast study are seen.  No space-occupying lesions of the cerebellopontine angles are seen on either side. Internal auditory canals are normal on both sides.

2. Deformity and medial protrusion of the medial wall of the left orbit is noted likely due to old, blowout fracture of the medial wall of the left orbit.  Please correlate with the clinical history.  Correlation with prior imaging studies of the brain would be useful.

3. Normal pituitary gland.  No focal lesions of the suprasellar cistern.

4. Sinusitis involving right frontal sinus and bilateral ethmoid sinuses.

## 2023-01-26 ENCOUNTER — Other Ambulatory Visit (RURAL_HEALTH_CENTER): Payer: Self-pay | Admitting: INTERNAL MEDICINE

## 2023-01-26 ENCOUNTER — Telehealth (RURAL_HEALTH_CENTER): Payer: Self-pay | Admitting: INTERNAL MEDICINE

## 2023-01-26 MED ORDER — AMOXICILLIN 875 MG-POTASSIUM CLAVULANATE 125 MG TABLET
1.0000 | ORAL_TABLET | Freq: Two times a day (BID) | ORAL | 0 refills | Status: DC
Start: 2023-01-26 — End: 2023-07-18

## 2023-01-26 NOTE — Telephone Encounter (Signed)
Pt was notified and said the fracture could be from different things that happened when he was younger he said he would call his eye dr and let them know and see if they thing it has anything to do with his vision and also hasn't been to [place] yet but will get it scheduled soon.. he is ok with you sending in antibiotics for him

## 2023-01-26 NOTE — Telephone Encounter (Signed)
No masses, lesions. Ears appear to be normal, so no reason for the tinnitus seen.  Does have what looks like an old, blow-out fracture of the inner wall of his left orbit. I dont remember any trauma that could have caused that.  The fracture could be contributing to headache and vision changes, but I'm not sure. May be worth seeing ophtho.  Also shows sinusitis, so we can send in antibiotics if he is agreeable.    Did patient see Dr Renda Rolls about his blood pressure?

## 2023-01-27 ENCOUNTER — Encounter (INDEPENDENT_AMBULATORY_CARE_PROVIDER_SITE_OTHER): Payer: Self-pay | Admitting: NURSE PRACTITIONER

## 2023-03-10 IMAGING — US US ABDOMEN COMPLETE
1 series · 14 of 25 positions shown · non-contrast
Comparison: Previous exam dated 10/17/2020.

﻿EXAM: US ABDOMEN COMPLETE
INDICATION: Follow-up cyst of the left lobe. Fatty liver.

[Series 1: us abdomen complete · 14 of 72 slices shown]
[im 1/72]
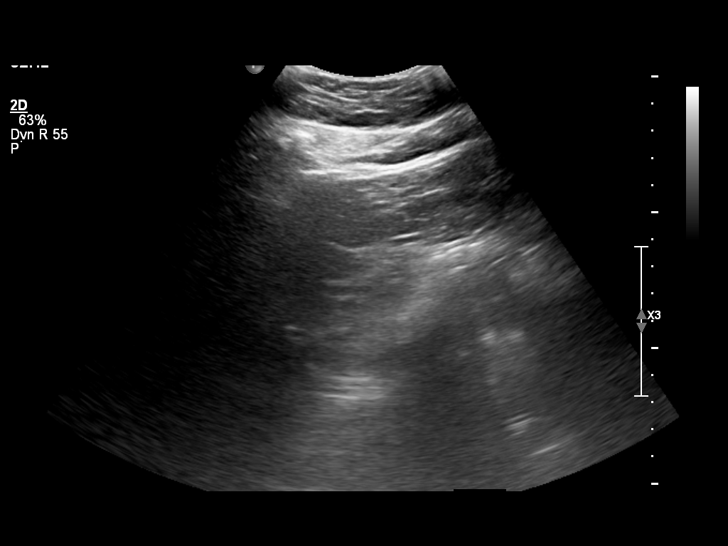
[im 6/72]
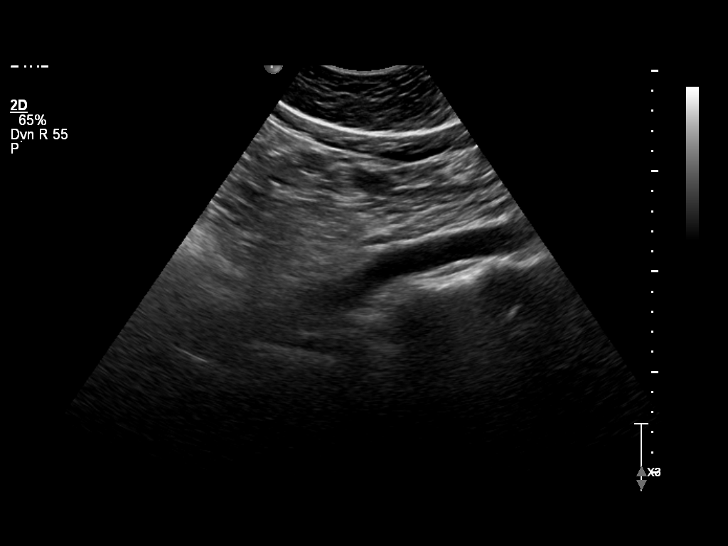
[im 12/72]
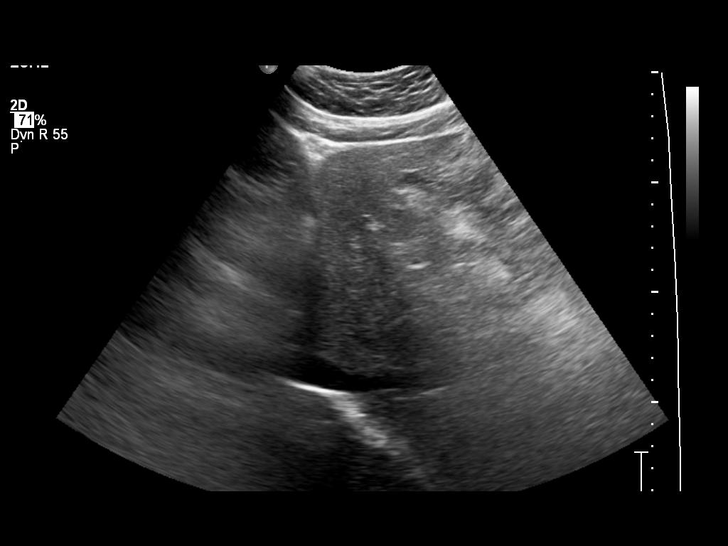
[im 18/72]
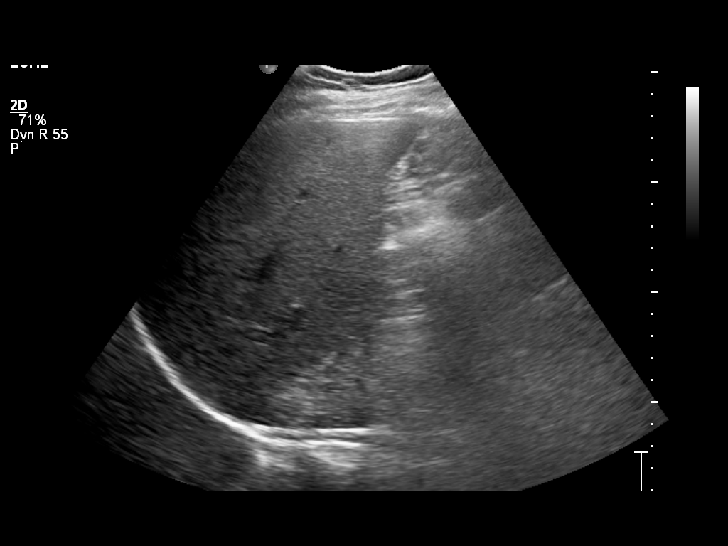
[im 24/72]
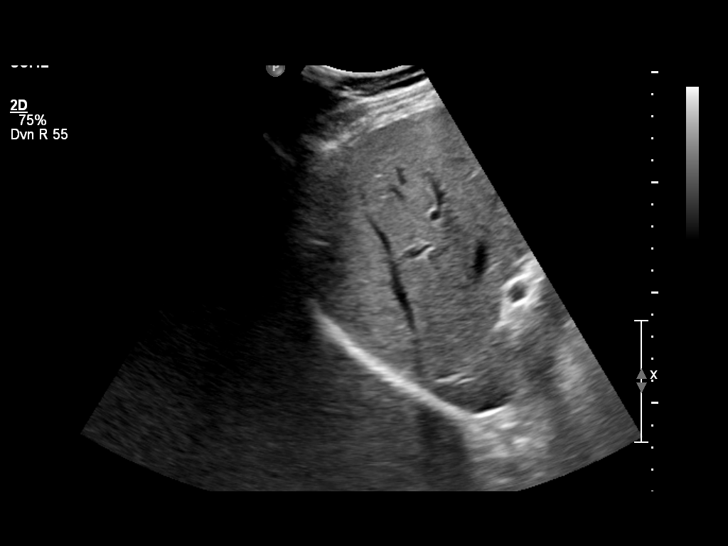
[im 27/72]
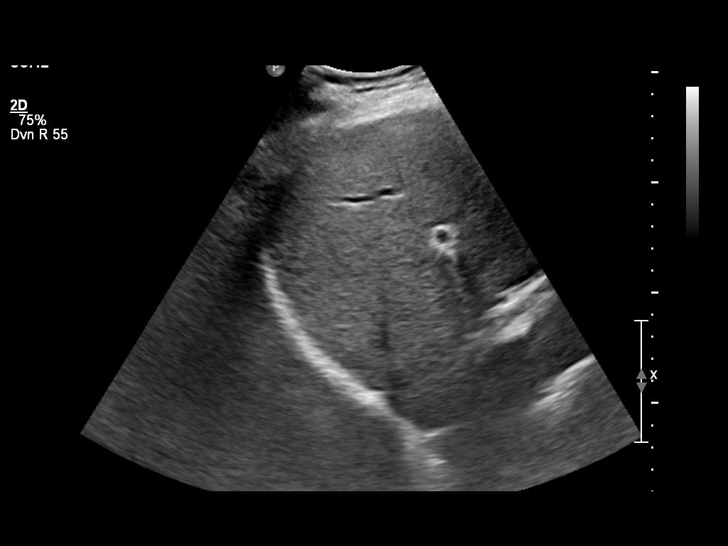
[im 33/72]
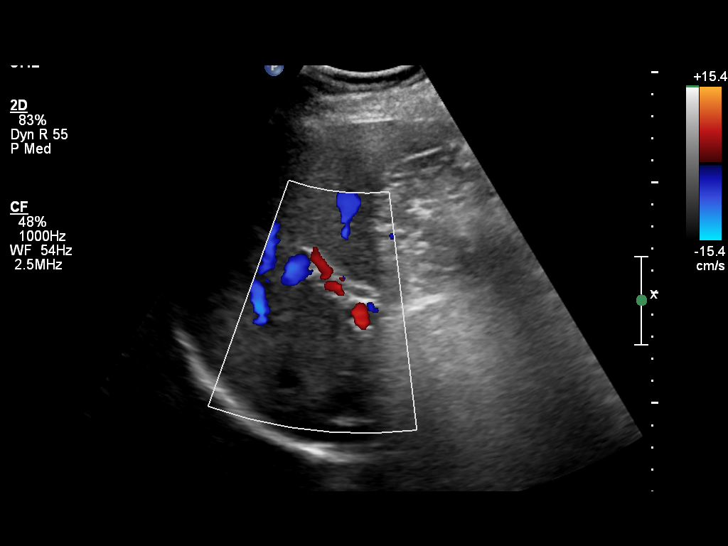
[im 39/72]
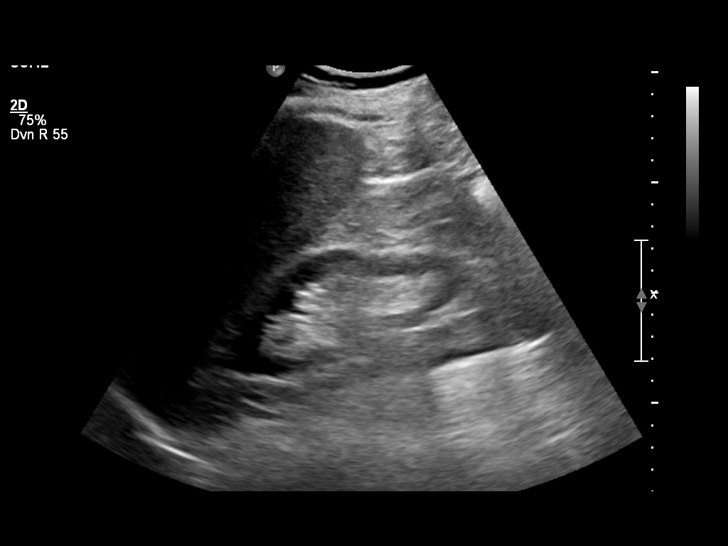
[im 45/72]
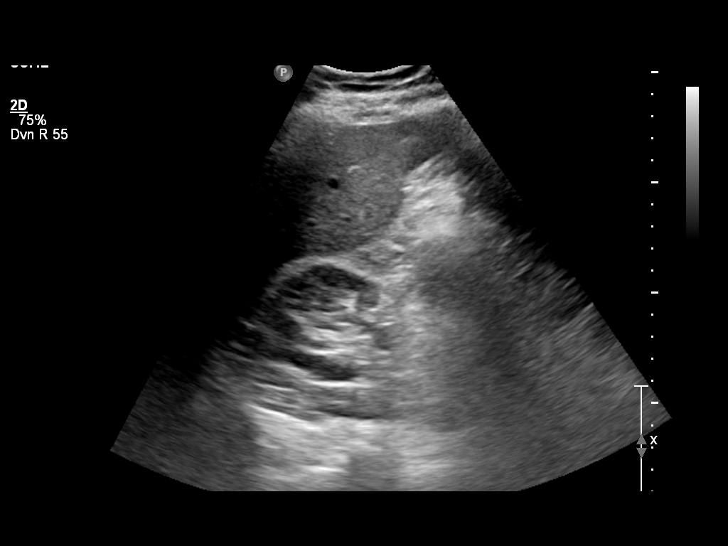
[im 48/72]
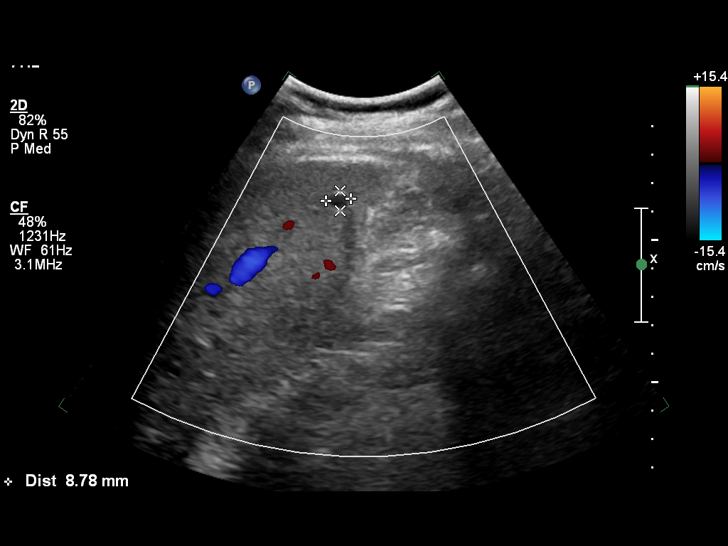
[im 54/72]
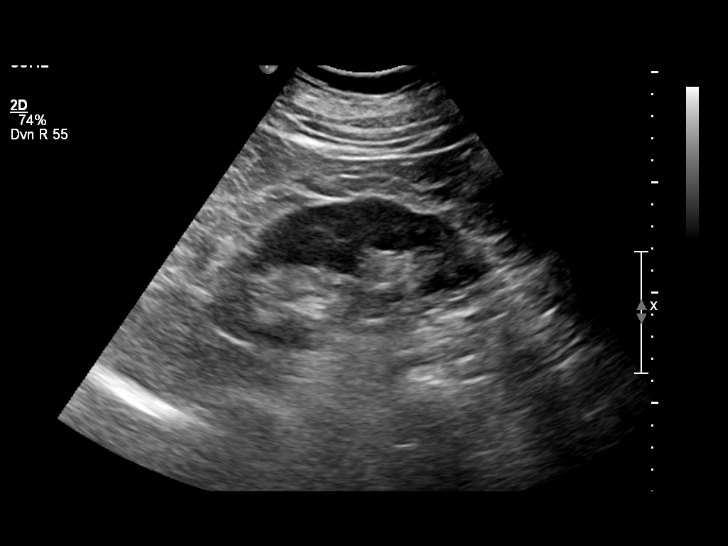
[im 60/72]
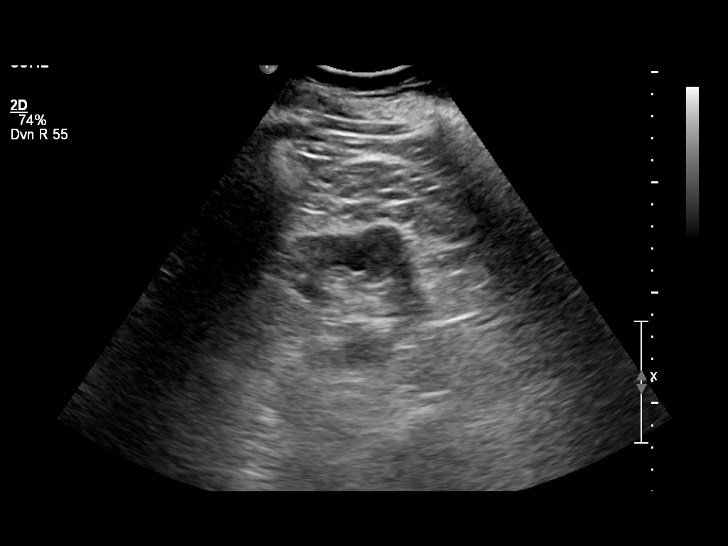
[im 66/72]
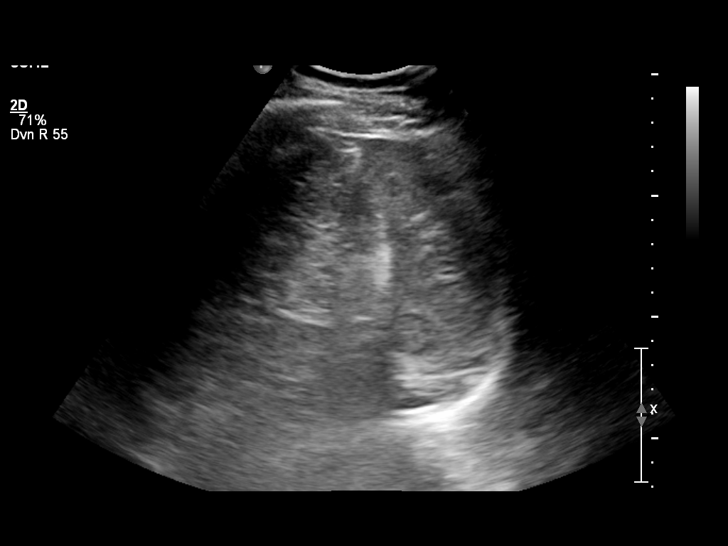
[im 72/72]
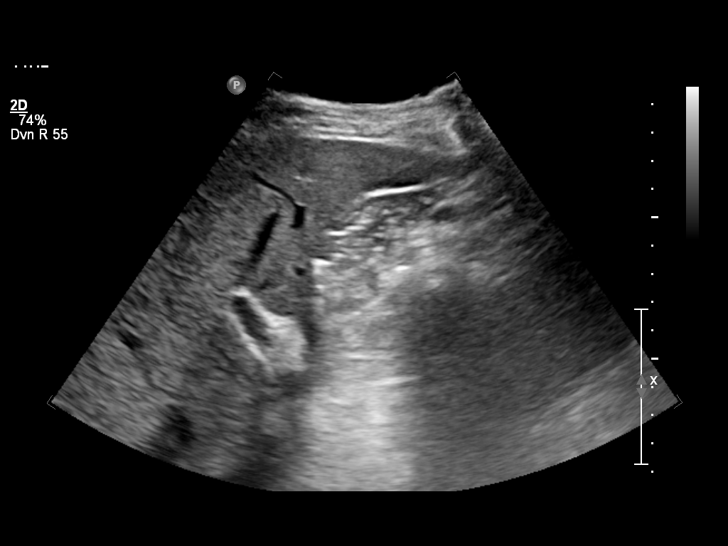

[14 of 25 positions shown; findings below may reference images not displayed]

FINDINGS: Gallbladder is absent. Extrahepatic bile ducts are normal in size measuring 8.4 mm.

Mild hepatomegaly measuring 17 cm in length with echogenic texture due to fatty liver.  Stable small cystic lesions of the liver measuring up to 2 cm.  Normal size spleen. Aorta, vena cava and kidneys are normal.  Pancreas is poorly visualized. No free fluid.
IMPRESSION: 1. Status post cholecystectomy.  Normal size bile duct.

2. Mild hepatomegaly with fatty liver. Stable small cystic lesions.  No free fluid.

3. Pancreas incompletely visualized due to artifact from overlying bowel gas.

## 2023-07-18 ENCOUNTER — Other Ambulatory Visit: Payer: Self-pay

## 2023-07-18 ENCOUNTER — Encounter (INDEPENDENT_AMBULATORY_CARE_PROVIDER_SITE_OTHER): Payer: Self-pay | Admitting: NURSE PRACTITIONER

## 2023-07-18 ENCOUNTER — Ambulatory Visit: Payer: 59 | Attending: NURSE PRACTITIONER | Admitting: NURSE PRACTITIONER

## 2023-07-18 ENCOUNTER — Telehealth (INDEPENDENT_AMBULATORY_CARE_PROVIDER_SITE_OTHER): Payer: Self-pay | Admitting: NURSE PRACTITIONER

## 2023-07-18 DIAGNOSIS — H9313 Tinnitus, bilateral: Secondary | ICD-10-CM | POA: Insufficient documentation

## 2023-07-18 DIAGNOSIS — Z8616 Personal history of COVID-19: Secondary | ICD-10-CM

## 2023-07-18 DIAGNOSIS — H903 Sensorineural hearing loss, bilateral: Secondary | ICD-10-CM | POA: Insufficient documentation

## 2023-07-18 MED ORDER — PREDNISONE 10 MG TABLET
ORAL_TABLET | ORAL | 0 refills | Status: DC
Start: 2023-07-18 — End: 2023-07-20

## 2023-07-18 NOTE — Progress Notes (Unsigned)
[facility]  [address]  [place] [address]  Phone: [phone]  Fax: [phone]      Encounter Date: 07/18/2023    Patient ID: Isaiah Long  MRN: [id number]    DOB: July 02, 1966  Age: 57 y.o. male     Progress Note       Referring Provider:  Terald Sleeper,*    Reason for Visit:   Chief Complaint   Patient presents with    Follow-up After Testing     Rc after audio. Pt complains of otalgia R ear. Roaring still present L ear. Pt diagnosed with COVID        History of Present Illness:  Isaiah Long is a 57 y.o. male follow up after audiogram.  He is using Dermotic oil drops and this helps itching.  Continues to have tinnitus that increases in intensity at times.  He had some improved tinnitus with prednisone taper in the past.      Patient History:  Patient Active Problem List   Diagnosis    Mixed hyperlipidemia    Gastroesophageal reflux disease without esophagitis    Osteoarthritis    Allergic rhinitis    Vitamin D deficiency    TMJ (temporomandibular joint disorder)    Dysphagia    Dysphonia    Statin myopathy     Current Outpatient Medications   Medication Sig    atenoloL (TENORMIN) 50 mg Oral Tablet Take 1 Tablet (50 mg total) by mouth Once a day    famotidine (PEPCID) 40 mg Oral Tablet Take 1 Tablet (40 mg total) by mouth Twice daily    Fluocinolone Acetonide Oil (DERMOTIC OIL) 0.01 % Otic Drops 2-3 drops both ear canals nightly as needed    fluticasone propionate (FLONASE) 50 mcg/actuation Nasal Spray, Suspension Administer 1 Spray into each nostril Twice daily    montelukast (SINGULAIR) 10 mg Oral Tablet Take 1 Tablet (10 mg total) by mouth Every evening    omeprazole (PRILOSEC) 40 mg Oral Capsule, Delayed Release(E.C.) Take 1 Capsule (40 mg total) by mouth Once a day    predniSONE (DELTASONE) 10 mg Oral Tablet 4 tablets daily for 3 days, 3 tablets daily for 3 days, 2 tablets daily for 3 days, 1 tablet daily for 3 days      Allergies   Allergen Reactions    Doxycycline Rash     Dexamethasone (Pf)  Other Adverse Reaction (Add comment)     hiccups     Past Medical History:   Diagnosis Date    Allergic rhinitis     Esophageal reflux     Essential hypertension     Hypercholesterolemia     Osteoarthritis      Past Surgical History:   Procedure Laterality Date    HX CHOLECYSTECTOMY      HX HIP REPLACEMENT      HX ROTATOR CUFF REPAIR      SKIN CANCER EXCISION       Family Medical History:       Problem Relation (Age of Onset)    Asthma Mother    Atrial fibrillation Mother    Prostate Cancer Father            Social History     Tobacco Use    Smoking status: Never    Smokeless tobacco: Never   Vaping Use    Vaping status: Never Used   Substance Use Topics    Alcohol use: Never    Drug use: Never  Review of Systems     There were no vitals filed for this visit.   ENT Physical Exam  Constitutional  Appearance: patient appears well-developed, well-nourished and well-groomed,  Communication/Voice: communication appropriate for developmental age; vocal quality normal;  Head and Face  Appearance: head appears normal, face appears normal and face appears atraumatic;  Palpation: facial palpation normal;  Salivary: glands normal;  Ear  Hearing: intact;  Auricles: right auricle normal; left auricle normal;  External Mastoids: right external mastoid normal; left external mastoid normal;  Ear Canals: right ear canal normal; left ear canal normal;  Tympanic Membranes: left tympanic membrane normal;  Ear comments: Right TM retracted  Nose  External Nose: nares patent bilaterally; external nose normal;  Internal Nose: nasal mucosa normal; septum normal; bilateral inferior turbinates normal;  Oral Cavity/Oropharynx  Lips: normal;  Teeth: normal;  Gums: gingiva normal;  Tongue: normal;  Oral mucosa: normal;  Hard palate: normal;  Soft palate: normal;  Tonsils: normal;  Base of Tongue: normal;  Posterior pharyngeal wall: normal;  Neck  Neck: neck normal; neck palpation normal;  Thyroid: thyroid  normal;  Respiratory  Inspection: breathing unlabored; normal breathing rate;  Neurovestibular  Mental Status: alert and oriented;  Psychiatric: mood normal; affect is appropriate;  Cranial Nerves: cranial nerves intact;       Assessment:  ENCOUNTER DIAGNOSES     ICD-10-CM   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  Audiogram shows stable normal to mild SNHL left and small air bone gap in the right ear with stable normal to moderately severe SNHL right.  Will start prednisone taper  Continue Dermotic oil drops as needed for itching EAC    Orders Placed This Encounter    Fluocinolone Acetonide Oil (DERMOTIC OIL) 0.01 % Otic Drops    predniSONE (DELTASONE) 10 mg Oral Tablet     Return in about 1 month (around 08/17/2023).    Elnora Morrison, FNP-BC  07/18/2023, 09:55

## 2023-07-18 NOTE — Telephone Encounter (Signed)
Patient called back and stated you prescribed him Derm otic in the past

## 2023-07-20 MED ORDER — FLUOCINOLONE ACETONIDE OIL 0.01 % EAR DROPS
OTIC | 2 refills | Status: DC
Start: 2023-07-20 — End: 2024-05-08

## 2023-07-20 MED ORDER — PREDNISONE 10 MG TABLET
ORAL_TABLET | ORAL | 0 refills | Status: DC
Start: 2023-07-20 — End: 2023-08-17

## 2023-08-17 ENCOUNTER — Ambulatory Visit: Payer: 59 | Attending: NURSE PRACTITIONER | Admitting: NURSE PRACTITIONER

## 2023-08-17 ENCOUNTER — Encounter (INDEPENDENT_AMBULATORY_CARE_PROVIDER_SITE_OTHER): Payer: Self-pay | Admitting: NURSE PRACTITIONER

## 2023-08-17 ENCOUNTER — Other Ambulatory Visit: Payer: Self-pay

## 2023-08-17 VITALS — Ht 71.0 in | Wt 210.0 lb

## 2023-08-17 DIAGNOSIS — H9313 Tinnitus, bilateral: Secondary | ICD-10-CM | POA: Insufficient documentation

## 2023-08-17 DIAGNOSIS — H903 Sensorineural hearing loss, bilateral: Secondary | ICD-10-CM | POA: Insufficient documentation

## 2023-08-17 NOTE — Progress Notes (Signed)
[facility], [facility]  [address]  [place] [address]  Phone: [phone]  Fax: [phone]      Encounter Date: 08/17/2023    Patient ID: DIJOHN REGEHR  MRN: [id number]    DOB: 07/14/1966  Age: 57 y.o. male     Progress Note       Referring Provider:  Terald Sleeper,*    Reason for Visit:   Chief Complaint   Patient presents with    Ear Problem(s)     1 mo rc on ears. Pt states hearing is about the same         History of Present Illness:  Isaiah Long is a 57 y.o. male follow up tinnitus.  Treated with prednisone taper last month and no significant improvement.  He had MRI brain done 01/25/23 and no CP tumor.      Patient History:  Patient Active Problem List   Diagnosis    Mixed hyperlipidemia    Gastroesophageal reflux disease without esophagitis    Osteoarthritis    Allergic rhinitis    Vitamin D deficiency    TMJ (temporomandibular joint disorder)    Dysphagia    Dysphonia    Statin myopathy     Current Outpatient Medications   Medication Sig    atenoloL (TENORMIN) 50 mg Oral Tablet Take 1 Tablet (50 mg total) by mouth Once a day    famotidine (PEPCID) 40 mg Oral Tablet Take 1 Tablet (40 mg total) by mouth Twice daily    Fluocinolone Acetonide Oil (DERMOTIC OIL) 0.01 % Otic Drops 2-3 drops both ear canals nightly as needed    fluticasone propionate (FLONASE) 50 mcg/actuation Nasal Spray, Suspension Administer 1 Spray into each nostril Twice daily    montelukast (SINGULAIR) 10 mg Oral Tablet Take 1 Tablet (10 mg total) by mouth Every evening    omeprazole (PRILOSEC) 40 mg Oral Capsule, Delayed Release(E.C.) Take 1 Capsule (40 mg total) by mouth Once a day      Allergies   Allergen Reactions    Doxycycline Rash    Dexamethasone (Pf)  Other Adverse Reaction (Add comment)     hiccups     Past Medical History:   Diagnosis Date    Allergic rhinitis     Esophageal reflux     Essential hypertension     Hypercholesterolemia     Osteoarthritis      Past Surgical History:   Procedure Laterality  Date    HX CHOLECYSTECTOMY      HX HIP REPLACEMENT      HX ROTATOR CUFF REPAIR      SKIN CANCER EXCISION       Family Medical History:       Problem Relation (Age of Onset)    Asthma Mother    Atrial fibrillation Mother    Prostate Cancer Father            Social History     Tobacco Use    Smoking status: Never    Smokeless tobacco: Never   Vaping Use    Vaping status: Never Used   Substance Use Topics    Alcohol use: Never    Drug use: Never       Review of Systems     Vitals:    08/17/23 1446   Weight: 95.3 kg (210 lb)   Height: 1.803 m (5\' 11" )   BMI: 29.29      ENT Physical Exam  Constitutional  Appearance: patient appears well-developed,  well-nourished and well-groomed,  Communication/Voice: communication appropriate for developmental age; vocal quality normal;  Head and Face  Appearance: head appears normal, face appears normal and face appears atraumatic;  Palpation: facial palpation normal;  Salivary: glands normal;  Ear  Hearing: intact;  Auricles: right auricle normal; left auricle normal;  External Mastoids: right external mastoid normal; left external mastoid normal;  Ear Canals: right ear canal normal; left ear canal normal;  Tympanic Membranes: left tympanic membrane normal;  Ear comments: Right TM retracted  Nose  External Nose: nares patent bilaterally; external nose normal;  Internal Nose: nasal mucosa normal; septum normal; bilateral inferior turbinates normal;  Oral Cavity/Oropharynx  Lips: normal;  Teeth: normal;  Gums: gingiva normal;  Tongue: normal;  Oral mucosa: normal;  Hard palate: normal;  Soft palate: normal;  Tonsils: normal;  Base of Tongue: normal;  Posterior pharyngeal wall: normal;  Neck  Neck: neck normal; neck palpation normal;  Thyroid: thyroid normal;  Respiratory  Inspection: breathing unlabored; normal breathing rate;  Neurovestibular  Mental Status: alert and oriented;  Psychiatric: mood normal; affect is appropriate;  Cranial Nerves: cranial nerves intact;        Assessment:  ENCOUNTER DIAGNOSES     ICD-10-CM   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  Will continue to monitor asymmetric SNHL.  Repeat audiogram in 6 months    Orders Placed This Encounter    AMB PRN REFERRAL EXTERNAL AUDIOLOGIST     Return in about 6 months (around 02/15/2024).    Elnora Morrison, FNP-BC  08/17/2023, 14:59

## 2023-09-01 ENCOUNTER — Other Ambulatory Visit: Payer: Self-pay

## 2024-01-17 ENCOUNTER — Other Ambulatory Visit (RURAL_HEALTH_CENTER): Payer: Self-pay | Admitting: INTERNAL MEDICINE

## 2024-02-10 ENCOUNTER — Other Ambulatory Visit (HOSPITAL_COMMUNITY): Payer: Self-pay | Admitting: INTERNAL MEDICINE

## 2024-02-10 DIAGNOSIS — R079 Chest pain, unspecified: Secondary | ICD-10-CM

## 2024-02-13 ENCOUNTER — Encounter (HOSPITAL_COMMUNITY): Payer: Self-pay

## 2024-02-15 ENCOUNTER — Ambulatory Visit (INDEPENDENT_AMBULATORY_CARE_PROVIDER_SITE_OTHER): Payer: Self-pay | Admitting: NURSE PRACTITIONER

## 2024-02-16 ENCOUNTER — Encounter (HOSPITAL_COMMUNITY): Payer: Self-pay

## 2024-03-07 ENCOUNTER — Other Ambulatory Visit: Payer: Self-pay

## 2024-03-07 ENCOUNTER — Ambulatory Visit
Admission: RE | Admit: 2024-03-07 | Discharge: 2024-03-07 | Disposition: A | Discharge status: Home / Self Care | Source: Ambulatory Visit | Attending: INTERNAL MEDICINE | Admitting: INTERNAL MEDICINE

## 2024-03-07 ENCOUNTER — Ambulatory Visit (HOSPITAL_COMMUNITY)

## 2024-03-07 DIAGNOSIS — R079 Chest pain, unspecified: Secondary | ICD-10-CM | POA: Insufficient documentation

## 2024-03-07 LAB — CREATININE WITH EGFR
CREATININE: 1.05 mg/dL (ref 0.60–1.30)
ESTIMATED GFR: 82 mL/min/1.73mˆ2 (ref >59)

## 2024-03-07 MED ORDER — IOPAMIDOL 370 MG IODINE/ML (76 %) INTRAVENOUS SOLUTION
85.0000 mL | INTRAVENOUS | Status: AC
Start: 2024-03-07 — End: 2024-03-07
  Administered 2024-03-07: 85 mL via INTRAVENOUS

## 2024-03-07 MED ORDER — METOPROLOL TARTRATE 5 MG/5 ML INTRAVENOUS SOLUTION
5.0000 mg | INTRAVENOUS | Status: DC
Start: 2024-03-07 — End: 2024-03-07

## 2024-03-07 MED ORDER — NITROGLYCERIN 0.4 MG SUBLINGUAL TABLET
SUBLINGUAL_TABLET | SUBLINGUAL | Status: AC
Start: 2024-03-07 — End: 2024-03-07
  Filled 2024-03-07: qty 1

## 2024-03-07 MED ORDER — NITROGLYCERIN 0.4 MG SUBLINGUAL TABLET
0.4000 mg | SUBLINGUAL_TABLET | SUBLINGUAL | Status: AC
Start: 2024-03-07 — End: 2024-03-07
  Administered 2024-03-07: 0.4 mg via SUBLINGUAL

## 2024-03-07 NOTE — Nurses Notes (Signed)
 Patient arrived for Coronary CTA.  Educated on exam, contrast, and medication use.  All questions answered, verbalized understanding.      Arrival VS BP 119/64, HR 59.  Will follow protocol    HR maintained 60 or less; remained on CT table.    Medicated with Nitroglycerin  0.4 mg SL.  Tolerated exam without difficulty    Post orthostatic VS obtained:    Lying:  BP 101/66, HR 58  Sitting:  BP 109/69, HR 59  Standing:  BP 102/74, HR 64    Discharge instructions discussed, verbalized understanding.      Able to ambulate out of facility without difficulty at [phone]

## 2024-03-28 DIAGNOSIS — R079 Chest pain, unspecified: Secondary | ICD-10-CM

## 2024-03-28 LAB — CTA HEART CORONARY
Biplane Simpson EF: 0 %
CT Ascending aorta: 3.7 cm
CT Sinus: 4.1 cm
LCX (AGATSTON SCORE): 0
LVIDS 2D: 0 cm
RCA (AGATSTON SCORE): 0
Total Agatston: 0

## 2024-04-10 ENCOUNTER — Telehealth (INDEPENDENT_AMBULATORY_CARE_PROVIDER_SITE_OTHER): Payer: Self-pay | Admitting: NURSE PRACTITIONER

## 2024-04-10 DIAGNOSIS — H903 Sensorineural hearing loss, bilateral: Secondary | ICD-10-CM

## 2024-04-10 DIAGNOSIS — H9313 Tinnitus, bilateral: Secondary | ICD-10-CM

## 2024-04-10 NOTE — Telephone Encounter (Signed)
 Patient called.  Missed his audio and f/u and called to r/s with [facility].  Was advised they couldn't get him back in until October.  He is wondering if he can have audio done somewhere else that can get him in sooner. Please call patient and advise.

## 2024-04-12 ENCOUNTER — Telehealth (INDEPENDENT_AMBULATORY_CARE_PROVIDER_SITE_OTHER): Payer: Self-pay | Admitting: NURSE PRACTITIONER

## 2024-04-12 NOTE — Telephone Encounter (Signed)
 Pt has been informed of appointment with [facility] on 04/24/2024 9 am   F/u with Lindle Blazer on 05/08/2024 8:45 am

## 2024-04-19 ENCOUNTER — Telehealth (INDEPENDENT_AMBULATORY_CARE_PROVIDER_SITE_OTHER): Payer: Self-pay | Admitting: INTERNAL MEDICINE

## 2024-04-19 NOTE — Telephone Encounter (Signed)
 Pt called and scheduled [facility] for Sept 11 at [facility]. Cath lab will call day before to let him know what time to be there, also needs a driver. Pt voiced understanding of all above info.

## 2024-04-27 ENCOUNTER — Encounter (HOSPITAL_COMMUNITY): Payer: Self-pay | Admitting: INTERVENTIONAL CARDIOLOGY

## 2024-04-27 ENCOUNTER — Ambulatory Visit
Admission: RE | Admit: 2024-04-27 | Discharge: 2024-04-27 | Disposition: A | Discharge status: Home / Self Care | Attending: INTERVENTIONAL CARDIOLOGY | Admitting: INTERVENTIONAL CARDIOLOGY

## 2024-04-27 ENCOUNTER — Other Ambulatory Visit: Payer: Self-pay

## 2024-04-27 ENCOUNTER — Encounter (HOSPITAL_COMMUNITY)
Admission: RE | Disposition: A | Discharge status: Home / Self Care | Payer: Self-pay | Source: Home / Self Care | Attending: INTERVENTIONAL CARDIOLOGY

## 2024-04-27 DIAGNOSIS — E785 Hyperlipidemia, unspecified: Secondary | ICD-10-CM | POA: Insufficient documentation

## 2024-04-27 DIAGNOSIS — Z79899 Other long term (current) drug therapy: Secondary | ICD-10-CM | POA: Insufficient documentation

## 2024-04-27 DIAGNOSIS — R001 Bradycardia, unspecified: Secondary | ICD-10-CM | POA: Insufficient documentation

## 2024-04-27 DIAGNOSIS — I2510 Atherosclerotic heart disease of native coronary artery without angina pectoris: Secondary | ICD-10-CM | POA: Insufficient documentation

## 2024-04-27 DIAGNOSIS — I10 Essential (primary) hypertension: Secondary | ICD-10-CM | POA: Insufficient documentation

## 2024-04-27 DIAGNOSIS — Q245 Malformation of coronary vessels: Secondary | ICD-10-CM

## 2024-04-27 DIAGNOSIS — I2584 Coronary atherosclerosis due to calcified coronary lesion: Secondary | ICD-10-CM

## 2024-04-27 DIAGNOSIS — E669 Obesity, unspecified: Secondary | ICD-10-CM | POA: Insufficient documentation

## 2024-04-27 DIAGNOSIS — R931 Abnormal findings on diagnostic imaging of heart and coronary circulation: Secondary | ICD-10-CM | POA: Insufficient documentation

## 2024-04-27 DIAGNOSIS — R079 Chest pain, unspecified: Secondary | ICD-10-CM | POA: Insufficient documentation

## 2024-04-27 DIAGNOSIS — Z6829 Body mass index (BMI) 29.0-29.9, adult: Secondary | ICD-10-CM | POA: Insufficient documentation

## 2024-04-27 HISTORY — DX: Tinnitus, unspecified ear: H93.19

## 2024-04-27 LAB — COMPREHENSIVE METABOLIC PANEL, NON-FASTING
ALBUMIN/GLOBULIN RATIO: 1.8 — ABNORMAL HIGH (ref 0.8–1.4)
ALBUMIN: 4.6 g/dL (ref 3.5–5.7)
ALKALINE PHOSPHATASE: 45 U/L (ref 34–104)
ALT (SGPT): 20 U/L (ref 7–52)
ANION GAP: 7 mmol/L (ref 4–13)
AST (SGOT): 14 U/L (ref 13–39)
BILIRUBIN TOTAL: 0.6 mg/dL (ref 0.3–1.0)
BUN/CREA RATIO: 15 (ref 6–22)
BUN: 16 mg/dL (ref 7–25)
CALCIUM, CORRECTED: 8.9 mg/dL (ref 8.9–10.8)
CALCIUM: 9.4 mg/dL (ref 8.6–10.3)
CHLORIDE: 107 mmol/L (ref 98–107)
CO2 TOTAL: 25 mmol/L (ref 21–31)
CREATININE: 1.04 mg/dL (ref 0.60–1.30)
ESTIMATED GFR: 83 mL/min/1.73mˆ2 (ref >59)
GLOBULIN: 2.5 (ref 2.0–3.5)
GLUCOSE: 102 mg/dL (ref 74–109)
OSMOLALITY, CALCULATED: 279 mosm/kg (ref 270–290)
POTASSIUM: 4.1 mmol/L (ref 3.5–5.1)
PROTEIN TOTAL: 7.1 g/dL (ref 6.4–8.9)
SODIUM: 139 mmol/L (ref 136–145)

## 2024-04-27 LAB — CBC WITH DIFF
BASOPHIL #: 0 x10ˆ3/uL (ref 0.00–0.10)
BASOPHIL %: 1 % (ref 0–1)
EOSINOPHIL #: 0.3 x10ˆ3/uL (ref 0.00–0.60)
EOSINOPHIL %: 5 % (ref 1–8)
HCT: 45.4 % (ref 36.7–47.1)
HGB: 16 g/dL (ref 12.5–16.3)
LYMPHOCYTE #: 1.4 x10ˆ3/uL (ref 1.00–3.00)
LYMPHOCYTE %: 24 % (ref 15–43)
MCH: 30.9 pg (ref 23.8–33.4)
MCHC: 35.3 g/dL (ref 32.5–36.3)
MCV: 87.7 fL (ref 73.0–96.2)
MONOCYTE #: 0.5 x10ˆ3/uL (ref 0.30–1.10)
MONOCYTE %: 8 % (ref 6–14)
MPV: 7.9 fL (ref 7.4–11.4)
NEUTROPHIL #: 3.6 x10ˆ3/uL (ref 1.85–7.84)
NEUTROPHIL %: 62 % (ref 44–74)
PLATELETS: 179 x10ˆ3/uL (ref 140–440)
RBC: 5.17 x10ˆ6/uL (ref 4.06–5.63)
RDW: 13.3 % (ref 12.1–16.2)
WBC: 5.7 x10ˆ3/uL (ref 3.6–10.2)

## 2024-04-27 LAB — MAGNESIUM: MAGNESIUM: 2.1 mg/dL (ref 1.9–2.7)

## 2024-04-27 LAB — PTT (PARTIAL THROMBOPLASTIN TIME): APTT: 30.9 s (ref 25.0–38.0)

## 2024-04-27 LAB — ECG 12 LEAD
Atrial Rate: 59 {beats}/min
Calculated P Axis: 41 degrees
Calculated R Axis: 32 degrees
Calculated T Axis: 28 degrees
PR Interval: 188 ms
QRS Duration: 82 ms
QT Interval: 378 ms
QTC Calculation: 374 ms
Ventricular rate: 59 {beats}/min

## 2024-04-27 LAB — PT/INR
INR: 0.95 (ref 0.84–1.10)
PROTHROMBIN TIME: 10.7 s (ref 9.8–12.7)

## 2024-04-27 SURGERY — CORONARY ANGIOGRAPHY W/LEFT HEART CATH W/WO LVG
Anesthesia: IV Sedation (Nurse Monitored)

## 2024-04-27 MED ORDER — ADENOSINE (FFR) 180 MG IN 90 ML NS (TOT VOL) - 2 MG/ML INJECTION
INTRAVENOUS | Status: DC | PRN
Start: 2024-04-27 — End: 2024-04-27
  Administered 2024-04-27: 140 ug/kg/min via INTRAVENOUS
  Administered 2024-04-27: 0 via INTRAVENOUS

## 2024-04-27 MED ORDER — NITROGLYCERIN 0.4 MG SUBLINGUAL TABLET
SUBLINGUAL_TABLET | Freq: Once | SUBLINGUAL | Status: DC | PRN
Start: 2024-04-27 — End: 2024-04-27
  Administered 2024-04-27: .4 mg via SUBLINGUAL

## 2024-04-27 MED ORDER — LIDOCAINE HCL 10 MG/ML (1 %) INJECTION SOLUTION
Freq: Once | INTRAMUSCULAR | Status: DC | PRN
Start: 2024-04-27 — End: 2024-04-27
  Administered 2024-04-27: 2 mL via INTRADERMAL

## 2024-04-27 MED ORDER — ASPIRIN 81 MG CHEWABLE TABLET
CHEWABLE_TABLET | ORAL | Status: AC
Start: 2024-04-27 — End: 2024-04-27
  Filled 2024-04-27: qty 4

## 2024-04-27 MED ORDER — DIPHENHYDRAMINE 25 MG CAPSULE
25.0000 mg | ORAL_CAPSULE | ORAL | Status: AC
Start: 2024-04-27 — End: 2024-04-27
  Administered 2024-04-27: 25 mg via ORAL

## 2024-04-27 MED ORDER — DIPHENHYDRAMINE 25 MG CAPSULE
ORAL_CAPSULE | ORAL | Status: AC
Start: 2024-04-27 — End: 2024-04-27
  Filled 2024-04-27: qty 1

## 2024-04-27 MED ORDER — LIDOCAINE HCL 10 MG/ML (1 %) INJECTION SOLUTION
INTRAMUSCULAR | Status: AC
Start: 2024-04-27 — End: 2024-04-27
  Filled 2024-04-27: qty 50

## 2024-04-27 MED ORDER — NITROGLYCERIN 100 MCG/ML IN NS INJECTION
INJECTION | INTRAVENOUS | Status: AC
Start: 2024-04-27 — End: 2024-04-27
  Filled 2024-04-27: qty 50

## 2024-04-27 MED ORDER — VERAPAMIL 2.5 MG/ML INTRAVENOUS SOLUTION
INTRAVENOUS | Status: AC
Start: 2024-04-27 — End: 2024-04-27
  Filled 2024-04-27: qty 2

## 2024-04-27 MED ORDER — DIAZEPAM 5 MG TABLET
5.0000 mg | ORAL_TABLET | ORAL | Status: AC
Start: 2024-04-27 — End: 2024-04-27
  Administered 2024-04-27: 5 mg via ORAL

## 2024-04-27 MED ORDER — FENTANYL (PF) 50 MCG/ML INJECTION WRAPPER
INJECTION | Freq: Once | INTRAMUSCULAR | Status: DC | PRN
Start: 2024-04-27 — End: 2024-04-27
  Administered 2024-04-27: 25 ug via INTRAVENOUS
  Administered 2024-04-27: 50 ug via INTRAVENOUS
  Administered 2024-04-27 (×2): 25 ug via INTRAVENOUS
  Administered 2024-04-27: 50 ug via INTRAVENOUS

## 2024-04-27 MED ORDER — FENTANYL (PF) 50 MCG/ML INJECTION SOLUTION
INTRAMUSCULAR | Status: AC
Start: 2024-04-27 — End: 2024-04-27
  Filled 2024-04-27: qty 2

## 2024-04-27 MED ORDER — SODIUM CHLORIDE 0.9 % INTRAVENOUS SOLUTION
INTRAVENOUS | Status: DC
Start: 2024-04-27 — End: 2024-04-27

## 2024-04-27 MED ORDER — IOHEXOL 350 MG IODINE/ML INTRAVENOUS SOLUTION
Freq: Once | INTRAVENOUS | Status: DC | PRN
Start: 2024-04-27 — End: 2024-04-27
  Administered 2024-04-27: 190 mL via INTRA_ARTERIAL

## 2024-04-27 MED ORDER — BIVALIRUDIN 250 MG INTRAVENOUS POWDER FOR SOLUTION
INTRAVENOUS | Status: AC
Start: 2024-04-27 — End: 2024-04-27
  Filled 2024-04-27: qty 5

## 2024-04-27 MED ORDER — ADENOSINE (DIAGNOSTIC) 3 MG/ML INTRAVENOUS SOLUTION
INTRAVENOUS | Status: AC
Start: 2024-04-27 — End: 2024-04-27
  Filled 2024-04-27: qty 60

## 2024-04-27 MED ORDER — ASPIRIN 81 MG CHEWABLE TABLET
324.0000 mg | CHEWABLE_TABLET | Freq: Once | ORAL | Status: AC
Start: 2024-04-27 — End: 2024-04-27
  Administered 2024-04-27: 324 mg via ORAL

## 2024-04-27 MED ORDER — MIDAZOLAM 5 MG/ML INJECTION WRAPPER
INTRAMUSCULAR | Status: AC
Start: 2024-04-27 — End: 2024-04-27
  Filled 2024-04-27: qty 1

## 2024-04-27 MED ORDER — DIAZEPAM 5 MG TABLET
ORAL_TABLET | ORAL | Status: AC
Start: 2024-04-27 — End: 2024-04-27
  Filled 2024-04-27: qty 1

## 2024-04-27 MED ORDER — ACETAMINOPHEN 325 MG TABLET
975.0000 mg | ORAL_TABLET | Freq: Once | ORAL | Status: DC | PRN
Start: 2024-04-27 — End: 2024-04-27

## 2024-04-27 MED ORDER — VERAPAMIL 2.5 MG/ML INTRAVENOUS SOLUTION
Freq: Once | INTRAVENOUS | Status: DC | PRN
Start: 2024-04-27 — End: 2024-04-27
  Administered 2024-04-27: 5 mg via INTRA_ARTERIAL

## 2024-04-27 MED ORDER — PRAVASTATIN 20 MG TABLET
20.0000 mg | ORAL_TABLET | Freq: Every evening | ORAL | 4 refills | Status: AC
Start: 2024-04-27 — End: ?

## 2024-04-27 MED ORDER — HEPARIN (PORCINE) (PF) 2,000 UNIT/1,000 ML IN 0.9 % SODIUM CHLORIDE IV
INTRAVENOUS | Status: AC
Start: 2024-04-27 — End: 2024-04-27
  Filled 2024-04-27: qty 2000

## 2024-04-27 MED ORDER — HEPARIN (PORCINE) 1,000 UNIT/ML INJECTION SOLUTION
Freq: Once | INTRAMUSCULAR | Status: DC | PRN
Start: 2024-04-27 — End: 2024-04-27
  Administered 2024-04-27: 2000 [IU] via INTRAVENOUS
  Administered 2024-04-27: 4600 [IU] via INTRAVENOUS

## 2024-04-27 MED ORDER — ASPIRIN 81 MG TABLET,DELAYED RELEASE
81.0000 mg | DELAYED_RELEASE_TABLET | Freq: Every day | ORAL | Status: AC
Start: 2024-04-27 — End: ?

## 2024-04-27 MED ORDER — HEPARIN (PORCINE) 1,000 UNIT/ML INJECTION SOLUTION
INTRAMUSCULAR | Status: AC
Start: 2024-04-27 — End: 2024-04-27
  Filled 2024-04-27: qty 10

## 2024-04-27 MED ORDER — DOBUTAMINE 1,000 MG/250 ML(4,000 MCG/ML) IN 5 % DEXTROSE IV
INTRAVENOUS | Status: DC | PRN
Start: 2024-04-27 — End: 2024-04-27
  Administered 2024-04-27: 20 ug/kg/min via INTRAVENOUS
  Administered 2024-04-27: 5 ug/kg/min via INTRAVENOUS
  Administered 2024-04-27: 15 ug/kg/min via INTRAVENOUS
  Administered 2024-04-27: 10 ug/kg/min via INTRAVENOUS
  Administered 2024-04-27: 0 via INTRAVENOUS

## 2024-04-27 MED ORDER — MIDAZOLAM 5 MG/ML INJECTION WRAPPER
Freq: Once | INTRAMUSCULAR | Status: DC | PRN
Start: 2024-04-27 — End: 2024-04-27
  Administered 2024-04-27 (×5): 1 mg via INTRAVENOUS

## 2024-04-27 MED ORDER — NITROGLYCERIN 100 MCG/ML IN NS INJECTION
INJECTION | Freq: Once | INTRAVENOUS | Status: DC | PRN
Start: 2024-04-27 — End: 2024-04-27
  Administered 2024-04-27: 200 ug via INTRA_ARTERIAL

## 2024-04-27 SURGICAL SUPPLY — 18 items
CATH ANGIO 5FR PGTL ST CURVE 110CM PERFORMA MIV 6 SH RADOPQ BRD RADIAL PEBAX PLYCRB NYL STRL LF (CATHETERS) ×2 IMPLANT
CATH ANGIO 5FR RADIAL TIG 4 CURVE 100CM OPTITORQUE LRG LUM SH 2 BRD SFT TIP COR SS NYL POLYUR (CATHETERS) ×2 IMPLANT
CATH ANGIO 6FR AL1 CURVE 100CM PERFORMA RADOPQ BRD PERI PEBAX PLYCRB NYL STRL ACPT .038IN GW (CATHETERS) ×2 IMPLANT
CATH ANGIO 6FR JACKY RADIAL 3.5 CURVE 100CM OPTITORQUE LRG LUM 2 SH 2 BRD SFT TIP COR SS NYL POLYUR (CATHETERS) ×2 IMPLANT
CATH GD 6FR 100CM AL1.5 CURVE LNCHR LRG LUM RADOPQ FLXB DIST SEG COR PERI NYL STRL LF (CATHETERS) ×2 IMPLANT
COVER PROBE 58X5.5IN TELESCOPICAL FOLD ELAS BAND GEL PKT STRL LF  CIV-FLX (MED SURG SUPPLIES) ×2 IMPLANT
DCNTR FLUID DISPENSR BAG BAJ DISP STRL LF  ASPT TRANSF (IV TUBING & ACCESSORIES) ×2 IMPLANT
DEVICE COMPRESS TR BAND 24CM 2 BAL TRNSPR ADJ STRAP REG RADIAL ART (MED SURG SUPPLIES) ×2 IMPLANT
GLOVE SURG 6.5 LF  PF BEAD CUF STRL CRM 11.3IN PROTEXIS PI PLISPRN THK9.1 MIL (GLOVES AND ACCESSORIES) ×4 IMPLANT
GW EMRLD .035IN 7CM 260CM FLXB END FIX COR EXCH PTFE VAS PERC 3MM RAD J CURVE DISP (WIRE) ×2 IMPLANT
GW GLDWR .035IN 3CM 150CM FLXB ANG TIP RADOPQ KINK RST NITINOL TUNG POLYUR HDRPH VAS STD (WIRE) ×2 IMPLANT
GW RUNTHROUGH .36MM 3CM 180CM RADOPQ X FLPY VAS STRL LF  DISP PTCA (WIRE) ×4 IMPLANT
KIT INTROD 10CM 6FR 22GA GLIDESHEATH SLNDR .021IN PLASTIC SHEATH DIL 2 WL PNCT SHORT ANG MINIWIRE 45 (INTRODUCER) ×2 IMPLANT
MCTH PRESS 150CM NAVVUS II STRL LF  DISP (VASCULAR) ×2 IMPLANT
SCATPAD RAD SHIELD 145 X 165 (MED SURG SUPPLIES) ×2 IMPLANT
SET ADMN MERIT MEDICAL SYS INJ MED CNRST S DISP (VASCULAR) ×2 IMPLANT
SPONGE GAUZE 4X4IN MDCHC COTTON 12 PLY TY 7 LF  STRL DISP (WOUND CARE SUPPLY) ×2 IMPLANT
TUBING PROCEDURE 72IN RADGR STRL DISP (MED SURG SUPPLIES) ×2 IMPLANT

## 2024-04-27 NOTE — Brief Op Note (Signed)
 The patient was brought to the cardiac catheterization lab and prepped and draped in the usual fashion.    Lidocaine  was administered to the right radial site.   Using real-time U/S guidance, patency confirmed and with true Seldinger technique, a 6 French slender sheath was placed in the right radial artery.    Standard catheters were used to engage and define the right and left coronary arteries.    A catheter was advanced to the left ventricle.    Left ventriculogram was performed by hand injection.    Pressure measurement was recorded within the left ventricle and upon removal across the aortic valve.    49F Al 1 guide to RCA  Heparin   RT wire  Dobutamine  stress  Baseline Pd/Pa 0.96  Dobutamine  70mcgs/kg/min- after 3 minutes, Pd/Pa 0.96  Dobutamine  60mcgs/kg/min- after 6 minutes, Pd/Pa 0.95  Dobutamine  20mcgs/kg/min- after 9 minutes, Pd/Pa 0.93  Dobutamine  off  Adenosine   FFR 0.89   Wires removed     The catheter and sheath were removed and hemostasis achieved with a transradial band.

## 2024-04-27 NOTE — Sedation Documentation (Signed)
 04/27/24  Procedure(s):  CORONARY ANGIOGRAPHY W/LEFT HEART CATH W/WO LVG  CARD US  GUIDED ACCESS  CORONARY FLOW MEASUREMENT - INITIAL VESSEL    Diagnosis:     Sedation Informed Consent, pre-sedation risk assessment and evaluation completed.  History of previous adverse experiences with sedation/analgesia/anesthesia assessed.  Monitored conscious sedation was administered under my direct supervision by an appropriately trained sedation nurse.  Appropriate Facility and Equipment compliant.      Procedure time out  Timeouts       Wilder Dunnings, RT (R) at [facility] Apr 27, 2024 1039 EDT       Timeout Details       Timeout type: Preprocedure              Procedures       Panel 1: CORONARY ANGIOGRAPHY W/LEFT HEART CATH W/WO LVG with Artemus Romanoff, Garnette, MD    Panel 1: CARD US  GUIDED ACCESS with Yina Riviere, Garnette, MD              Timeout Questions    Correct patient? Yes  Correct site? Yes  Correct side? N/A  Correct position? Yes  Correct procedure? Yes  Site marked? N/A  H&P note completed? Yes  Consents verified? Yes  Radiology studies available? Yes  Relevant lab results available? Yes  Allergies reviewed? Yes  Are all required blood products & devices for the procedure available? Yes  Is documentation verified? Yes             Staff Present       Physicians  Christianjames Soule, Garnette, MD Staff  Salina Remak, RN  Wilder Dunnings, RT (R)  Shrader, Darice, RT (DEBERA Talbert Ellen, RN              Signing History       Staff Performed Signed    Wilder Dunnings, RT (R) [place] Apr 27, 2024 8960 EDT Fri Apr 27, 2024 1039 EDT                            Physician in  and out times  Physicians       Name Panel Role Time Period    Amarii Bordas, Garnette, MD Panel 1 Primary 04/27/2024 1009          Sedation Staff       Name Type Time Period    Salina Remak, RN Invasive Nurse 04/27/2024 1006            Sedation and Procedure Times:  Event Time In   CV Sedation Start 1009   CV Sedation Stop 1149          Proc Name Event Type Event Time    CORONARY ANGIOGRAPHY W/LEFT  HEART CATH W/WO LVG  [facility]ision Start Fri Apr 27, 2024 10:39 AM    CORONARY ANGIOGRAPHY W/LEFT HEART CATH W/WO LVG  Incision Close Fri Apr 27, 2024 11:49 AM    CARD US  GUIDED ACCESS  Incision Start Fri Apr 27, 2024 10:39 AM    CARD US  GUIDED ACCESS  Incision Close Fri Apr 27, 2024 11:49 AM    CORONARY FLOW MEASUREMENT - INITIAL VESSEL  Incision Start Fri Apr 27, 2024 11:22 AM    CORONARY FLOW MEASUREMENT - INITIAL VESSEL  Incision Close Fri Apr 27, 2024 11:49 AM            Aldrete Scores    Pre Sedation  Activity: 2-->able to move 4 extremities voluntarily or on  command  Respiration: 2-->able to breathe and cough freely  Circulation: 2-->BP within 20% of pre-anesthetic level  Consciousness: 2-->fully awake  O2 Saturation: 2-->able to maintain O2 saturation greater than 92% on room air  Dressing: 2-->dry and clean or not applicable  Pain: 2-->pain free  Ambulation: 2-->able to stand up and walk straight, on ordered bedrest, or performing at previous level of functioning  Fasting/Feeding: 2-->able to drink fluids or NPO, minimal nausea/ no vomiting  Urine Output: 2-->has voided, adequate urine output per device, or not applicable  Modified Aldrete Score: 20      Post Sedation  Assessment Scored:: Post-Procedure  Activity: 2-->able to move 4 extremities voluntarily or on command  Respiration: 2-->able to breathe and cough freely  Circulation: 2-->BP within 20% of pre-anesthetic level  Consciousness: 2-->fully awake  O2 Saturation: 2-->able to maintain O2 saturation greater than 92% on room air  Dressing: 2-->dry and clean or not applicable  Pain: 2-->pain free  Ambulation: 2-->able to stand up and walk straight, on ordered bedrest, or performing at previous level of functioning  Urine Output: 2-->has voided, adequate urine output per device, or not applicable  Post Modified Aldrete Score: 20      Sedation Type: Moderate Sedation     Medications (moderate): Fentanyl , Versed      Unit: [facility] Invasive Lab  IV Type: Peripheral  IV        Effects of Administration: Successful sedation w/o adverse events       Patient was continuously monitored throughout the procedure.  Provider was in attendance throughout sedation.  See Invasive Procedure Log for additional details.    Garnette Sink, MD       Blood loss: minimal  Samples: N/A  Complications: none

## 2024-04-27 NOTE — H&P (Signed)
 [facility]   Interventional Cardiology Consult                                                                            Eliyah, Bazzi T, 58 y.o. male  Date of Admission:  04/27/2024  Date of Birth:  February 21, 1966    04/27/2024    STOP: IF H&P IS GREATER THAN 30 DAYS FROM SURGICAL DAY COMPLETE NEW H&P IS REQUIRED.     H & P updated the day of the procedure.  1.  H&P completed within 30 days of surgical procedure and has been reviewed within 24 hours of admission but prior to surgery or a procedure requiring anesthesia services, the patient has been examined, and no change has occured in the patients condition since the H&P was completed.       Change in medications: No              Comments: see full H&P scanned in media    2.  Patient continues to be appropriate candidate for planned surgical procedure. YES    3. HPI: The patient follows with Dr. Sheralyn.  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  There is mild noncalcified plaque in the proximal RCA. Patient continuing to have intermittent chest pains.  Recommended for left heart catheterization to evaluate.    Leandrew JINNY Batty, APRN,FNP-BC

## 2024-04-27 NOTE — Nurses Notes (Signed)
 1515  Patient TR band removed at this time. No s/s of bleeding or hematoma note. Site cleaned and dressed with sterile 2x2 and op-site.       Patient IV removed intact at this time. No bleeding noted at site and pressure dressing applied.     1530  Patient discharged from cath lab at this time. Patient left department with all personal belongings, including AVS. Patient assisted into passenger side of POV via wheelchair with cath lab staff. Discharge instructions reinforced prior to patient leaving cath lab and understanding verbalized. All questions answered.    Rolin Shine, RN

## 2024-05-01 LAB — INVASIVE CARDIOLOGY PROCEDURE: Cath EF Quantitative: 55 %

## 2024-05-08 ENCOUNTER — Other Ambulatory Visit: Payer: Self-pay

## 2024-05-08 ENCOUNTER — Encounter (INDEPENDENT_AMBULATORY_CARE_PROVIDER_SITE_OTHER): Payer: Self-pay | Admitting: NURSE PRACTITIONER

## 2024-05-08 ENCOUNTER — Ambulatory Visit: Payer: Self-pay | Attending: NURSE PRACTITIONER | Admitting: NURSE PRACTITIONER

## 2024-05-08 VITALS — Ht 71.0 in | Wt 210.0 lb

## 2024-05-08 DIAGNOSIS — H9313 Tinnitus, bilateral: Secondary | ICD-10-CM | POA: Insufficient documentation

## 2024-05-08 DIAGNOSIS — H903 Sensorineural hearing loss, bilateral: Secondary | ICD-10-CM | POA: Insufficient documentation

## 2024-05-08 NOTE — Progress Notes (Signed)
 ENT, [facility]  [address]  [place] [address]  Phone: [phone]  Fax: [phone]      Encounter Date: 05/08/2024    Patient ID: Isaiah Long  MRN: [id number]    DOB: [birth year]  Age: 58 y.o. male     Progress Note       Referring Provider:  Henrietta Fleeting, DO    Reason for Visit:   Chief Complaint   Patient presents with    Follow-up After Testing     Rc after audio at Dr. Ellena office. No new complaints         History of Present Illness:  Isaiah Long is a 58 y.o. male follow up asymmetric SNHL.  Repeat audiogram completed.  No new ear complaints, no vertigo.    MRI brain 01/25/23 no CP tumor    Patient History:  Problem List[1]  Current Outpatient Medications   Medication Sig    aspirin  (ECOTRIN) 81 mg Oral Tablet, Delayed Release (E.C.) Take 1 Tablet (81 mg total) by mouth Daily    atenoloL  (TENORMIN ) 50 mg Oral Tablet Take 1 Tablet (50 mg total) by mouth Once a day    bethanechol chloride (URECHOLINE) 5 mg Oral Tablet Take 1 Tablet (5 mg total) by mouth Every night    cholecalciferol, vitamin D3, 250 mcg (10,000 unit) Oral Capsule Take 1 Capsule (10,000 Units total) by mouth Daily    ezetimibe  (ZETIA ) 10 mg Oral Tablet Take 1 Tablet (10 mg total) by mouth Every evening    famotidine  (PEPCID ) 40 mg Oral Tablet Take 1 Tablet (40 mg total) by mouth Every evening    fluticasone  propionate (FLONASE ) 50 mcg/actuation Nasal Spray, Suspension Administer 1 Spray into each nostril Twice daily    montelukast  (SINGULAIR ) 10 mg Oral Tablet Take 1 Tablet (10 mg total) by mouth Every evening    omeprazole  (PRILOSEC) 40 mg Oral Capsule, Delayed Release(E.C.) Take 1 Capsule (40 mg total) by mouth Once a day (Patient taking differently: Take 1 Capsule (40 mg total) by mouth Twice daily)    pravastatin  (PRAVACHOL ) 20 mg Oral Tablet Take 1 Tablet (20 mg total) by mouth Every evening    vitamin B complex Oral Tablet Take 1 Tablet by mouth Daily      Allergies[2]  Past Medical History:   Diagnosis Date     Allergic rhinitis     Esophageal reflux     Essential hypertension     Hypercholesterolemia     Osteoarthritis     Unilateral subjective nonpulsatile tinnitus without hearing loss, otoscopic finding, neurologic deficit, or head trauma      Past Surgical History:   Procedure Laterality Date    CARDIAC CATHETERIZATION      HX CHOLECYSTECTOMY      HX HIP REPLACEMENT      HX ROTATOR CUFF REPAIR      SKIN CANCER EXCISION       Family Medical History:       Problem Relation (Age of Onset)    Asthma Mother    Atrial fibrillation Mother    Coronary Artery Disease Father    Prostate Cancer Father            Social History[3]    Review of Systems     Vitals:    05/08/24 0904   Weight: 95.3 kg (210 lb)   Height: 1.803 m (5' 11)   BMI: 29.29      ENT Physical Exam  Constitutional  Appearance: patient  appears well-developed, well-nourished and well-groomed,  Communication/Voice: communication appropriate for developmental age; vocal quality normal;  Head and Face  Appearance: head appears normal, face appears normal and face appears atraumatic;  Palpation: facial palpation normal;  Salivary: glands normal;  Ear  Hearing: intact;  Auricles: right auricle normal; left auricle normal;  External Mastoids: right external mastoid normal; left external mastoid normal;  Ear Canals: right ear canal normal; left ear canal normal;  Tympanic Membranes: right tympanic membrane normal; left tympanic membrane normal;  Nose  External Nose: nares patent bilaterally; external nose normal;  Internal Nose: nasal mucosa normal; septum normal; bilateral inferior turbinates normal;  Oral Cavity/Oropharynx  Lips: normal;  Teeth: normal;  Gums: gingiva normal;  Tongue: normal;  Oral mucosa: normal;  Hard palate: normal;  Soft palate: normal;  Tonsils: normal;  Base of Tongue: normal;  Posterior pharyngeal wall: normal;  Neck  Neck: neck normal; neck palpation normal;  Thyroid: thyroid normal;  Respiratory  Inspection: breathing unlabored; normal  breathing rate;  Neurovestibular  Mental Status: alert and oriented;  Psychiatric: mood normal; affect is appropriate;  Cranial Nerves: cranial nerves intact;       Assessment:  ENCOUNTER DIAGNOSES     ICD-10-CM   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  Reviewed audiogram [facility], this shows stable asymmetry in hearing at 8K Hz right ear.  Will repeat audio in 2 years or sooner if he notices changes    No orders of the defined types were placed in this encounter.    No follow-ups on file.    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

## 2024-07-18 ENCOUNTER — Encounter (HOSPITAL_COMMUNITY): Payer: Self-pay

## 2024-07-24 ENCOUNTER — Encounter (HOSPITAL_COMMUNITY): Payer: Self-pay

## 2025-05-08 ENCOUNTER — Ambulatory Visit (INDEPENDENT_AMBULATORY_CARE_PROVIDER_SITE_OTHER): Payer: Self-pay | Admitting: NURSE PRACTITIONER
